# Patient Record
Sex: Female | Born: 1955 | Race: Black or African American | Hispanic: No | Smoking: Never smoker
Health system: Southern US, Community
[De-identification: ages and names within clinical notes are randomized; demographics above are authoritative.]

## PROBLEM LIST (undated history)

## (undated) DIAGNOSIS — I10 Essential (primary) hypertension: Secondary | ICD-10-CM

## (undated) HISTORY — DX: Essential (primary) hypertension: I10

---

## 2006-01-30 ENCOUNTER — Emergency Department: Payer: Self-pay | Admitting: Emergency Medicine

## 2006-09-19 ENCOUNTER — Emergency Department: Payer: Self-pay

## 2008-01-07 ENCOUNTER — Encounter: Payer: Self-pay | Admitting: Family Medicine

## 2008-01-07 ENCOUNTER — Ambulatory Visit: Payer: Self-pay | Admitting: Family Medicine

## 2008-01-16 ENCOUNTER — Encounter (INDEPENDENT_AMBULATORY_CARE_PROVIDER_SITE_OTHER): Payer: Self-pay | Admitting: *Deleted

## 2008-01-22 ENCOUNTER — Encounter (INDEPENDENT_AMBULATORY_CARE_PROVIDER_SITE_OTHER): Payer: Self-pay | Admitting: *Deleted

## 2008-01-23 ENCOUNTER — Encounter: Payer: Self-pay | Admitting: Family Medicine

## 2008-01-23 ENCOUNTER — Ambulatory Visit: Payer: Self-pay | Admitting: Internal Medicine

## 2008-01-24 ENCOUNTER — Encounter (INDEPENDENT_AMBULATORY_CARE_PROVIDER_SITE_OTHER): Payer: Self-pay | Admitting: *Deleted

## 2008-01-29 ENCOUNTER — Encounter: Payer: Self-pay | Admitting: Internal Medicine

## 2009-04-24 HISTORY — PX: ABDOMINAL HYSTERECTOMY: SHX81

## 2009-11-03 ENCOUNTER — Ambulatory Visit: Payer: Self-pay

## 2009-11-04 ENCOUNTER — Inpatient Hospital Stay: Payer: Self-pay

## 2009-11-08 LAB — PATHOLOGY REPORT

## 2011-09-11 ENCOUNTER — Emergency Department: Payer: Self-pay | Admitting: Emergency Medicine

## 2013-02-02 ENCOUNTER — Emergency Department: Payer: Self-pay | Admitting: Emergency Medicine

## 2013-02-02 LAB — BASIC METABOLIC PANEL
Anion Gap: 11 (ref 7–16)
Co2: 25 mmol/L (ref 21–32)
Creatinine: 0.71 mg/dL (ref 0.60–1.30)
EGFR (Non-African Amer.): >60
Glucose: 96 mg/dL (ref 65–99)
Potassium: 3.3 mmol/L — ABNORMAL LOW (ref 3.5–5.1)
Sodium: 143 mmol/L (ref 136–145)

## 2013-02-02 LAB — CBC
HCT: 43.9 % (ref 35.0–47.0)
MCHC: 33.5 g/dL (ref 32.0–36.0)
MCV: 93 fL (ref 80–100)
Platelet: 256 10*3/uL (ref 150–440)
RDW: 12.6 % (ref 11.5–14.5)

## 2013-05-26 ENCOUNTER — Ambulatory Visit: Payer: Self-pay | Admitting: Physician Assistant

## 2013-10-21 ENCOUNTER — Emergency Department: Payer: Self-pay | Admitting: Emergency Medicine

## 2014-03-09 ENCOUNTER — Emergency Department: Payer: Self-pay | Admitting: Emergency Medicine

## 2014-05-05 ENCOUNTER — Emergency Department: Payer: Self-pay | Admitting: Emergency Medicine

## 2014-05-05 LAB — URINALYSIS, COMPLETE
BACTERIA: NONE SEEN
Bilirubin,UR: NEGATIVE
Blood: NEGATIVE
Glucose,UR: NEGATIVE mg/dL (ref 0–75)
Hyaline Cast: 3
LEUKOCYTE ESTERASE: NEGATIVE
NITRITE: NEGATIVE
Ph: 6 (ref 4.5–8.0)
Specific Gravity: 1.026 (ref 1.003–1.030)

## 2014-05-05 LAB — COMPREHENSIVE METABOLIC PANEL
ALBUMIN: 3.9 g/dL (ref 3.4–5.0)
ALT: 84 U/L — AB
ANION GAP: 11 (ref 7–16)
AST: 93 U/L — AB (ref 15–37)
Alkaline Phosphatase: 105 U/L
BUN: 8 mg/dL (ref 7–18)
Bilirubin,Total: 0.5 mg/dL (ref 0.2–1.0)
CALCIUM: 9 mg/dL (ref 8.5–10.1)
Chloride: 106 mmol/L (ref 98–107)
Co2: 25 mmol/L (ref 21–32)
Creatinine: 0.71 mg/dL (ref 0.60–1.30)
EGFR (Non-African Amer.): >60
GLUCOSE: 99 mg/dL (ref 65–99)
Osmolality: 281 (ref 275–301)
POTASSIUM: 3.8 mmol/L (ref 3.5–5.1)
Sodium: 142 mmol/L (ref 136–145)
Total Protein: 7.9 g/dL (ref 6.4–8.2)

## 2014-05-05 LAB — DRUG SCREEN, URINE
Amphetamines, Ur Screen: NEGATIVE (ref ?–1000)
BARBITURATES, UR SCREEN: NEGATIVE (ref ?–200)
BENZODIAZEPINE, UR SCRN: NEGATIVE (ref ?–200)
CANNABINOID 50 NG, UR ~~LOC~~: NEGATIVE (ref ?–50)
Cocaine Metabolite,Ur ~~LOC~~: NEGATIVE (ref ?–300)
MDMA (ECSTASY) UR SCREEN: NEGATIVE (ref ?–500)
METHADONE, UR SCREEN: NEGATIVE (ref ?–300)
Opiate, Ur Screen: NEGATIVE (ref ?–300)
Phencyclidine (PCP) Ur S: NEGATIVE (ref ?–25)
Tricyclic, Ur Screen: NEGATIVE (ref ?–1000)

## 2014-05-05 LAB — CBC
HCT: 46.1 % (ref 35.0–47.0)
HGB: 15.3 g/dL (ref 12.0–16.0)
MCH: 30.9 pg (ref 26.0–34.0)
MCHC: 33.1 g/dL (ref 32.0–36.0)
MCV: 93 fL (ref 80–100)
PLATELETS: 254 10*3/uL (ref 150–440)
RBC: 4.94 10*6/uL (ref 3.80–5.20)
RDW: 12.5 % (ref 11.5–14.5)
WBC: 5.7 10*3/uL (ref 3.6–11.0)

## 2014-05-05 LAB — ETHANOL

## 2014-05-05 LAB — TROPONIN I: Troponin-I: <0.02 ng/mL

## 2016-02-21 ENCOUNTER — Other Ambulatory Visit: Payer: Self-pay | Admitting: Physician Assistant

## 2016-02-21 DIAGNOSIS — Z1231 Encounter for screening mammogram for malignant neoplasm of breast: Secondary | ICD-10-CM

## 2016-02-28 ENCOUNTER — Ambulatory Visit: Payer: 59 | Admitting: Family

## 2016-02-28 ENCOUNTER — Ambulatory Visit: Payer: 59 | Admitting: Family Medicine

## 2016-03-31 ENCOUNTER — Encounter: Payer: Self-pay | Admitting: Radiology

## 2016-03-31 ENCOUNTER — Ambulatory Visit
Admission: RE | Admit: 2016-03-31 | Discharge: 2016-03-31 | Disposition: A | Discharge status: Home / Self Care | Payer: 59 | Source: Ambulatory Visit | Attending: Physician Assistant | Admitting: Physician Assistant

## 2016-03-31 DIAGNOSIS — Z1231 Encounter for screening mammogram for malignant neoplasm of breast: Secondary | ICD-10-CM | POA: Insufficient documentation

## 2016-04-03 ENCOUNTER — Ambulatory Visit: Payer: 59 | Admitting: Family Medicine

## 2016-04-03 DIAGNOSIS — Z0289 Encounter for other administrative examinations: Secondary | ICD-10-CM

## 2016-04-28 ENCOUNTER — Other Ambulatory Visit: Payer: Self-pay | Admitting: Nurse Practitioner

## 2016-04-28 ENCOUNTER — Ambulatory Visit
Admission: RE | Admit: 2016-04-28 | Discharge: 2016-04-28 | Disposition: A | Discharge status: Home / Self Care | Payer: 59 | Source: Ambulatory Visit | Attending: Nurse Practitioner | Admitting: Nurse Practitioner

## 2016-04-28 DIAGNOSIS — M25561 Pain in right knee: Secondary | ICD-10-CM

## 2016-05-22 ENCOUNTER — Ambulatory Visit: Payer: 59 | Admitting: Physical Therapy

## 2016-05-24 ENCOUNTER — Encounter: Payer: 59 | Admitting: Physical Therapy

## 2016-05-30 ENCOUNTER — Encounter: Payer: 59 | Admitting: Physical Therapy

## 2016-06-01 ENCOUNTER — Encounter: Payer: 59 | Admitting: Physical Therapy

## 2016-06-05 ENCOUNTER — Ambulatory Visit: Payer: 59 | Attending: Internal Medicine | Admitting: Physical Therapy

## 2016-06-05 DIAGNOSIS — M25561 Pain in right knee: Secondary | ICD-10-CM | POA: Insufficient documentation

## 2016-06-05 NOTE — Therapy (Signed)
[facility] PHYSICAL AND SPORTS MEDICINE [address] [place], [place], [address] Phone: [phone]   Fax:  [phone]  Physical Therapy Evaluation  Patient Details  Name: Ebony CarlsGlenda F Legrand MRN: [id number] Date of Birth: 01/11/1956 Referring Provider: Beverely RisenFozia Khan  Encounter Date: 06/05/2016      PT End of Session - 06/05/16 1714    Visit Number 1   Number of Visits 3   Date for PT Re-Evaluation 07/17/16   PT Start Time 1632   PT Stop Time 1710   PT Time Calculation (min) 38 min   Activity Tolerance Patient tolerated treatment well   Behavior [facility] for tasks assessed/performed      No past medical history on file.  No past surgical history on file.  There were no vitals filed for this visit.       Subjective Assessment - 06/05/16 1720    Subjective Patient reports around the new year she began having progressively intensifying R medial knee pain, to the point where she could not walk. She has been taking topical ointment and oral pain medications and has noticed near total reduction in symptoms. She is active and performs regular gym routine, denies any recent illnesses or bowel/bladder changes. Denies a history of low back pain. She does have pain if she palpates her medial proximal tibia, otherwise no residual symptoms.    Limitations Walking;Standing   Diagnostic tests X-ray -- negative    Patient Stated Goals To figure out why she had pain    Currently in Pain? 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     Cognition   Overall Cognitive  Status Within Functional Limits for tasks assessed     Observation/Other Assessments-Edema    Edema --  WNL     Sensation   Light Touch --  WNL     Functional Tests   Functional tests Sit to Stand;Step down;Step up     Step Up   Comments --  WNL     Step Down   Comments --  WNL     Sit to Stand   Comments --  WNL     ROM / Strength   AROM / PROM / Strength --  5/5 for all MMT, no pain reported     Palpation   Palpation comment --  Mild pain with deep palpation around medial proximal tibia     FABER test   findings Negative     Slump test   Findings Negative     Straight Leg Raise   Findings Negative     Ely's Test   Findings Negative     Ambulation/Gait   Gait Comments --  WNL, symmetrical      Spinal mobilizations in lumbar spine - no pain reported, no hypomobility noted.   Educated patient on standing calf stretch for HEP.  PT Education - 06/05/16 1713    Education provided Yes   Education Details Follow up in 2 weeks, will discharge at that time if no residual symptoms.    Person(s) Educated Patient   Methods Explanation   Comprehension Verbalized understanding             PT Long Term Goals - 06/05/16 1714      PT LONG TERM GOAL #1   Title Patient will report no pain while standing >1 hour to return to work related activities.    Baseline Previously had pain with standing/ambulation.    Time 8   Period Weeks   Status New               Plan - 06/05/16 1716    Clinical Impression Statement Patient reports an insidious onset of knee pain on R side, which appears to have resolved with topical ointments and pain medication. Patient does kneel to wash her face in the morning, it is conceivable to think she may have had a bone bruise, her pain is still present but very mild with palpation of medial portion of proximal tibial shaft, but is very localized. She had no pain with provactive testing in  this session, appears to be near her baseline. WIll follow up in 2 weeks to see if symptoms are still resolving at which time no additional therapy is likely needed.    Rehab Potential Excellent   Clinical Impairments Affecting Rehab Potential No pain aside from palpation now   PT Frequency 1x / week   PT Duration 3 weeks   PT Treatment/Interventions Manual techniques;Therapeutic exercise;Therapeutic activities;ADLs/Self Care Home Management   PT Next Visit Plan Follow up on pain in   Consulted and Agree with Plan of Care Patient      Patient will benefit from skilled therapeutic intervention in order to improve the following deficits and impairments:  Pain  Visit Diagnosis: Acute pain of right knee - Plan: PT plan of care cert/re-cert     Problem List There are no active problems to display for this patient.  Alva Garnet PT, DPT, CSCS    06/05/2016, 5:28 PM   [facility] PHYSICAL AND SPORTS MEDICINE [address] [place], [address] Phone: [phone]   Fax:  [phone]  Name: SEMAJA LYMON MRN: [id number] Date of Birth: 1955-10-23

## 2016-06-06 ENCOUNTER — Encounter: Payer: 59 | Admitting: Physical Therapy

## 2016-06-08 ENCOUNTER — Encounter: Payer: 59 | Admitting: Physical Therapy

## 2016-06-12 ENCOUNTER — Encounter: Payer: 59 | Admitting: Physical Therapy

## 2016-06-15 ENCOUNTER — Encounter: Payer: 59 | Admitting: Physical Therapy

## 2016-06-22 ENCOUNTER — Ambulatory Visit: Payer: 59 | Attending: Internal Medicine | Admitting: Physical Therapy

## 2016-09-22 ENCOUNTER — Encounter: Payer: Self-pay | Admitting: Emergency Medicine

## 2016-09-22 ENCOUNTER — Emergency Department
Admission: EM | Admit: 2016-09-22 | Discharge: 2016-09-22 | Disposition: A | Discharge status: Home / Self Care | Payer: 59 | Attending: Emergency Medicine | Admitting: Emergency Medicine

## 2016-09-22 DIAGNOSIS — M25561 Pain in right knee: Secondary | ICD-10-CM | POA: Diagnosis present

## 2016-09-22 MED ORDER — NAPROXEN 500 MG PO TABS
500.0000 mg | ORAL_TABLET | Freq: Once | ORAL | Status: AC
  Administered 2016-09-22: 500 mg via ORAL
  Filled 2016-09-22: qty 1

## 2016-09-22 MED ORDER — TRAMADOL HCL 50 MG PO TABS: 50.0000 mg | ORAL_TABLET | Freq: Four times a day (QID) | ORAL | 0 refills | Status: DC | PRN

## 2016-09-22 MED ORDER — NAPROXEN 500 MG PO TABS: 500.0000 mg | ORAL_TABLET | Freq: Two times a day (BID) | ORAL | Status: DC

## 2016-09-22 MED ORDER — TRAMADOL HCL 50 MG PO TABS
50.0000 mg | ORAL_TABLET | Freq: Once | ORAL | Status: DC
  Filled 2016-09-22: qty 1

## 2016-09-22 NOTE — ED Triage Notes (Signed)
Pt c/o right knee pain that started 3-4 days ago.  Had similar pain couple months ago and got better. No deformity noted. Pt ambulatory to triage; did not want wheelchair. No redness.  Pain with movement.

## 2016-09-22 NOTE — ED Provider Notes (Signed)
[facility] Emergency Department Provider Note   ____________________________________________   First MD Initiated Contact with Patient 09/22/16 1812     (approximate)  I have reviewed the triage vital signs and the nursing notes.   HISTORY  Chief Complaint Knee Pain    HPI Ebony Hutchinson is a 61 y.o. female patient complaining of right knee pain as started 3-4 days ago. Patient has similar pain a couple months ago and it resolved with one session of physical therapy. Patient denies any provocative incident to this pain. Patient appears on the medial aspect of her right knee. Patient states pain increases with flexion. Patient denies loss of sensation. Patient state he was x-ray was unremarkable couple months ago.Patient rates the pain as a 10 over 10. Patient describes pain as "achy". No recent palliative measures for this complaint   History reviewed. No pertinent past medical history.  Patient Active Problem List   Diagnosis Date Noted  . Acute pain of right knee 09/22/2016    Past Surgical History:  Procedure Laterality Date  . ABDOMINAL HYSTERECTOMY      Prior to Admission medications   Medication Sig Start Date End Date Taking? Authorizing Provider  naproxen (NAPROSYN) 500 MG tablet Take 1 tablet (500 mg total) by mouth 2 (two) times daily with a meal. 09/22/16   Joni ReiningSmith, Ronald K, PA-C  traMADol (ULTRAM) 50 MG tablet Take 1 tablet (50 mg total) by mouth every 6 (six) hours as needed for moderate pain. 09/22/16   Joni ReiningSmith, Ronald K, PA-C    Allergies Codeine  History reviewed. No pertinent family history.  Social History Social History  Substance Use Topics  . Smoking status: Never Smoker  . Smokeless tobacco: Never Used  . Alcohol use Yes     Comment: 2 beer per day    Review of Systems Constitutional: No fever/chills Eyes: No visual changes. ENT: No sore throat. Cardiovascular: Denies chest pain. Respiratory: Denies shortness of  breath. Gastrointestinal: No abdominal pain.  No nausea, no vomiting.  No diarrhea.  No constipation. Genitourinary: Negative for dysuria. Musculoskeletal: Right medial knee pain. Skin: Negative for rash. Neurological: Negative for headaches, focal weakness or numbness. Allergic/Immunilogical: Codeine ____________________________________________   PHYSICAL EXAM:  VITAL SIGNS: ED Triage Vitals  Enc Vitals Group     BP 09/22/16 1723 (!) 160/92     Pulse Rate 09/22/16 1723 81     Resp 09/22/16 1723 18     Temp 09/22/16 1723 98.4 F (36.9 C)     Temp Source 09/22/16 1723 Oral     SpO2 09/22/16 1723 98 %     Weight 09/22/16 1722 170 lb (77.1 kg)     Height 09/22/16 1722 5\' 2"  (1.575 m)     Head Circumference --      Peak Flow --      Pain Score 09/22/16 1722 10     Pain Loc --      Pain Edu? --      Excl. in GC? --     Constitutional: Alert and oriented. Well appearing and in no acute distress. Eyes: Conjunctivae are normal. PERRL. EOMI. Head: Atraumatic. Nose: No congestion/rhinnorhea. Mouth/Throat: Mucous membranes are moist.  Oropharynx non-erythematous. Neck: No stridor.  No cervical spine tenderness to palpation. Hematological/Lymphatic/Immunilogical: No cervical lymphadenopathy. Cardiovascular: Normal rate, regular rhythm. Grossly normal heart sounds.  Good peripheral circulation. Respiratory: Normal respiratory effort.  No retractions. Lungs CTAB. Gastrointestinal: Soft and nontender. No distention. No abdominal bruits. No CVA tenderness.  Musculoskeletal: No obvious deformity or edema to the right knee. Patient has moderate guarding palpation at the insertion point of the MCL. Patient has full range of motion but has guarding and a Clements pain when flexing the knee. Patient has normal gait.  Neurologic:  Normal speech and language. No gross focal neurologic deficits are appreciated. No gait instability. Skin:  Skin is warm, dry and intact. No rash noted. Psychiatric:  Mood and affect are normal. Speech and behavior are normal.  ____________________________________________   LABS (all labs ordered are listed, but only abnormal results are displayed)  Labs Reviewed - No data to display ____________________________________________  EKG   ____________________________________________  RADIOLOGY  Reviewed previous x-ray with no acute findings. ____________________________________________   PROCEDURES  Procedure(s) performed: None  Procedures  Critical Care performed: No  ____________________________________________   INITIAL IMPRESSION / ASSESSMENT AND PLAN / ED COURSE  Pertinent labs & imaging results that were available during my care of the patient were reviewed by me and considered in my medical decision making (see chart for details).  Knee pain secondary to MCL strain. Patient placed in a knee immobilizer. Patient given discharge care instructions and advised to follow-up with orthopedics for definitive evaluation and treatment.      ____________________________________________   FINAL CLINICAL IMPRESSION(S) / ED DIAGNOSES  Final diagnoses:  Acute pain of right knee      NEW MEDICATIONS STARTED DURING THIS VISIT:  New Prescriptions   NAPROXEN (NAPROSYN) 500 MG TABLET    Take 1 tablet (500 mg total) by mouth 2 (two) times daily with a meal.   TRAMADOL (ULTRAM) 50 MG TABLET    Take 1 tablet (50 mg total) by mouth every 6 (six) hours as needed for moderate pain.     Note:  This document was prepared using Dragon voice recognition software and may include unintentional dictation errors.    Joni Reining, PA-C 09/22/16 1817    Nita Sickle, MD 09/26/16 [phone]

## 2016-09-22 NOTE — Discharge Instructions (Signed)
Knee support until evaluation by orthopedics. Call Monday morning for appointment

## 2016-09-28 DIAGNOSIS — M25561 Pain in right knee: Secondary | ICD-10-CM | POA: Diagnosis not present

## 2017-05-31 IMAGING — CR DG KNEE COMPLETE 4+V*R*
1 series · 4 of 4 positions shown · non-contrast
Comparison: None.

CLINICAL DATA: Right knee pain for 2 weeks without known injury.

EXAM:
RIGHT KNEE - COMPLETE 4+ VIEW

[Series 1: dg knee complete 4 views right · 0.14mm/px · 4 of 4 slices shown]
[im 1/4]
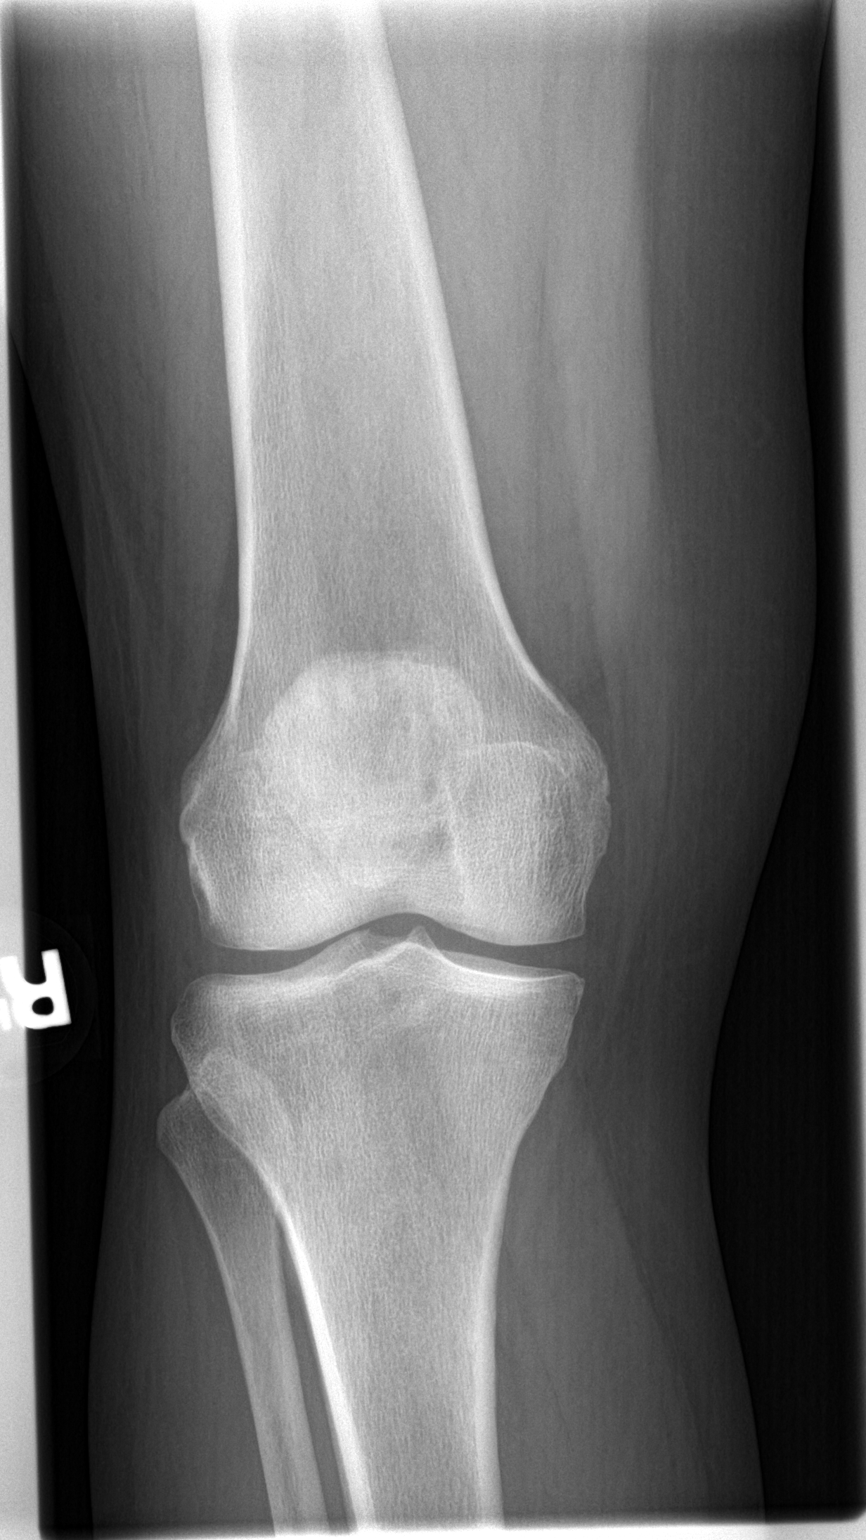
[im 2/4]
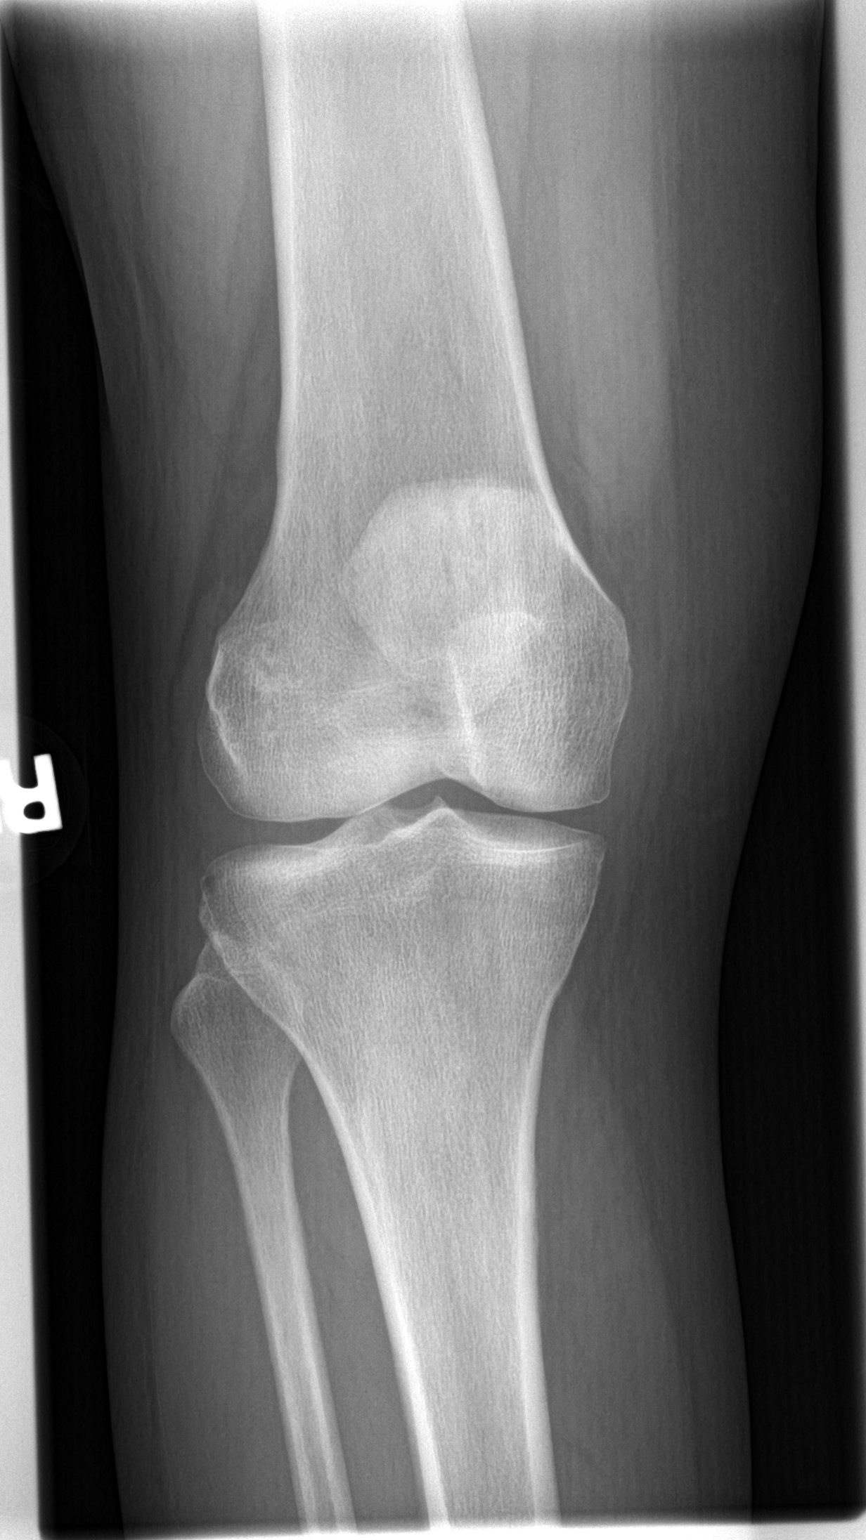
[im 3/4]
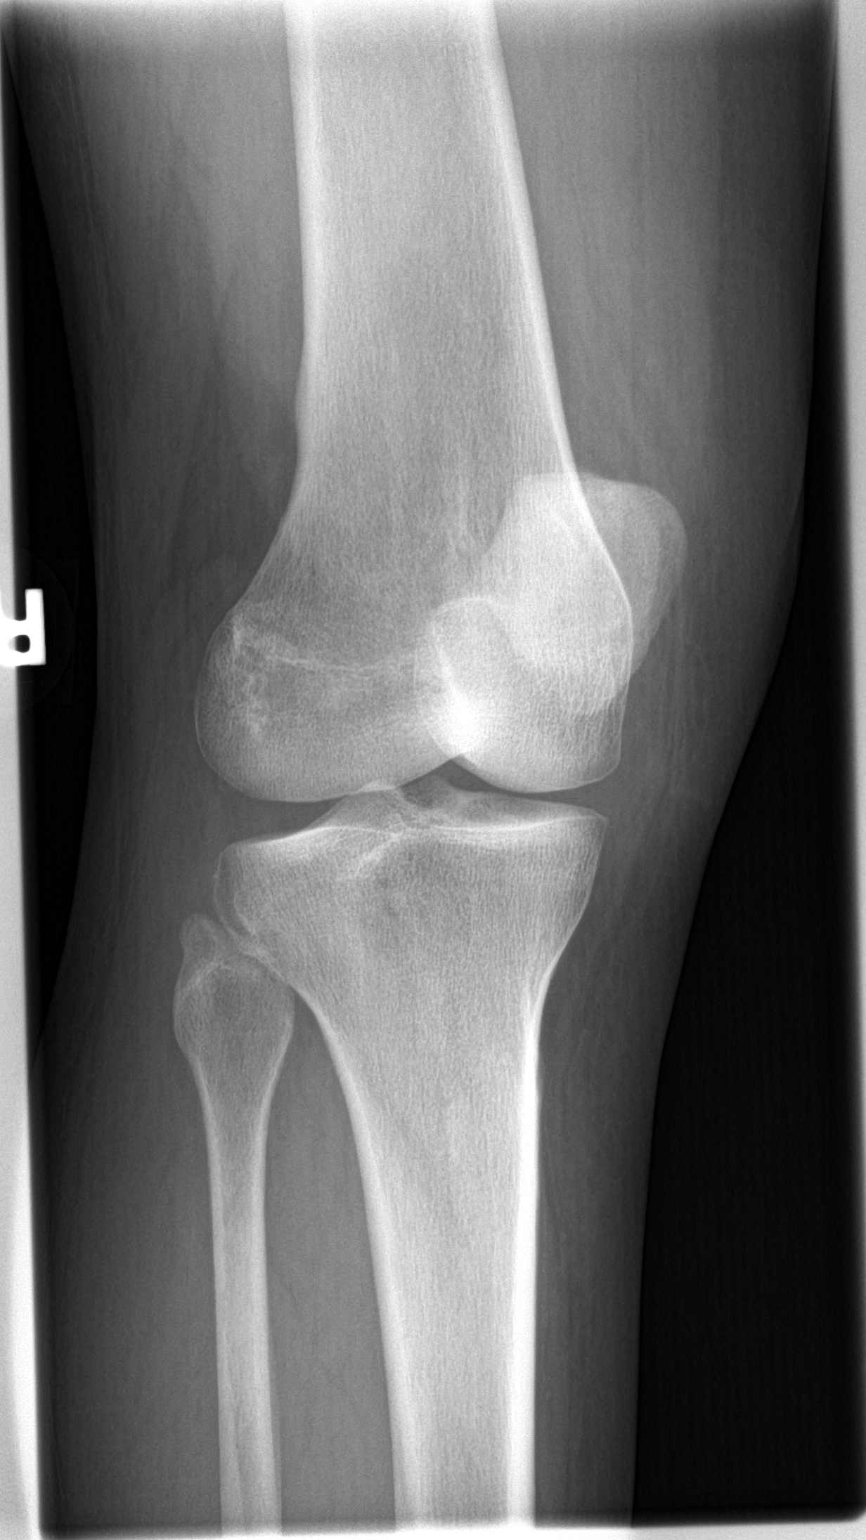
[im 4/4]
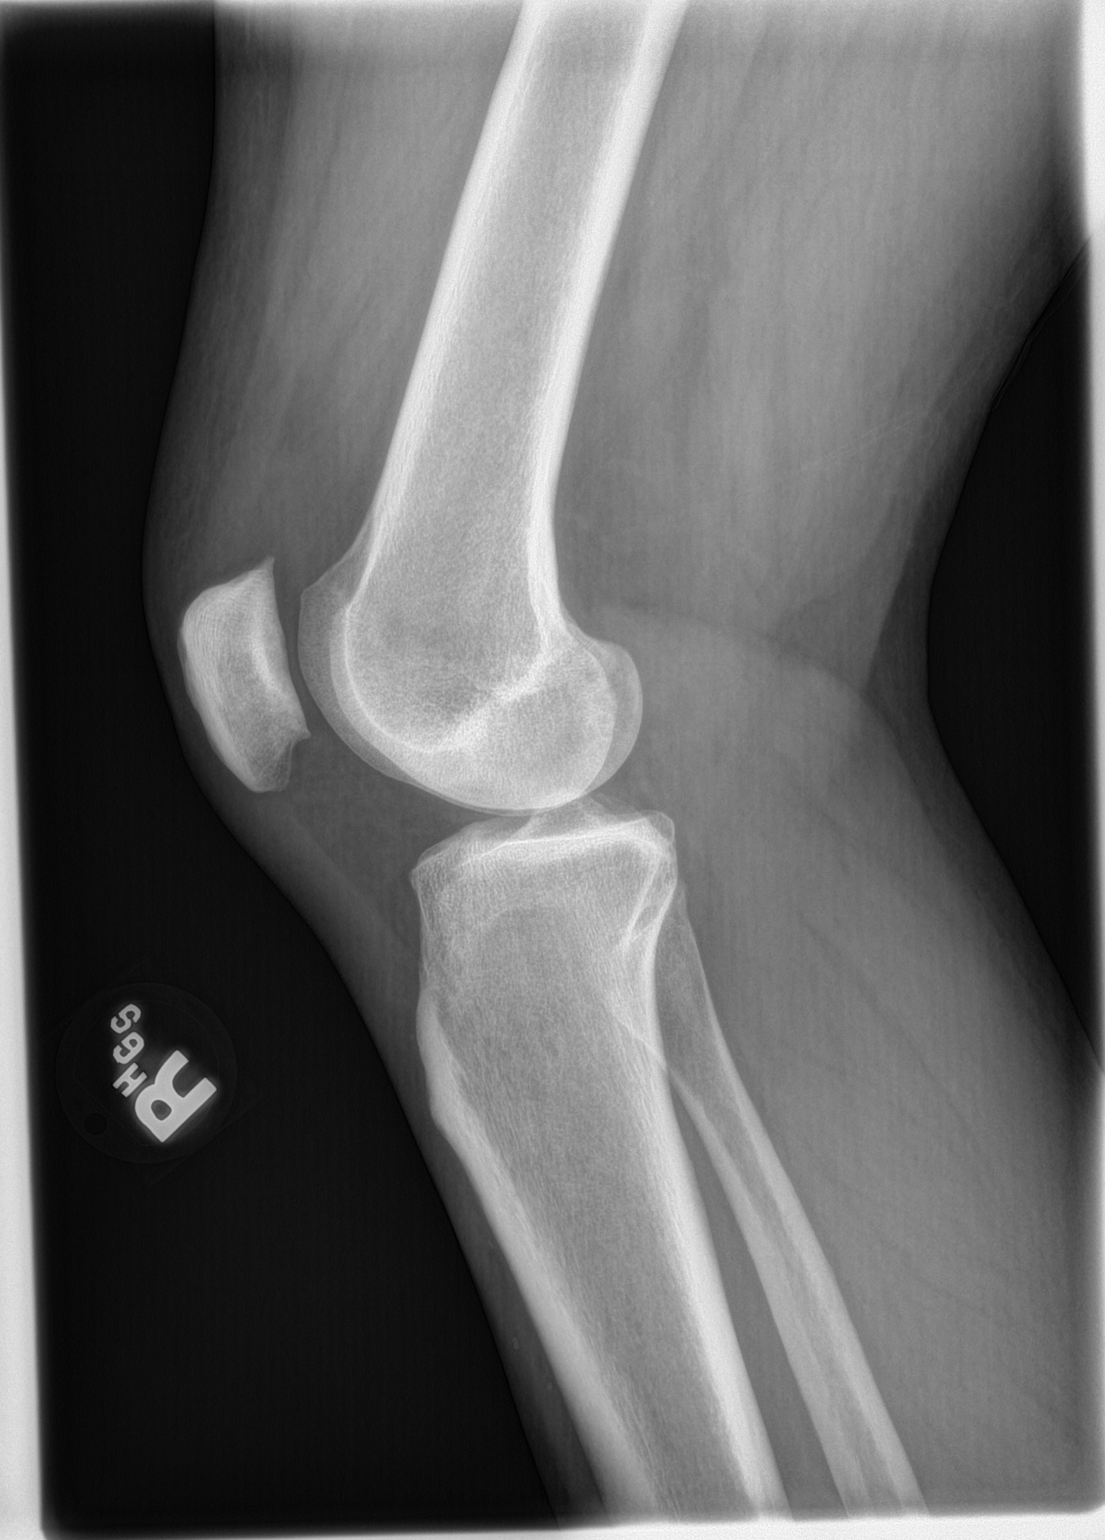

[4 of 4 positions shown; findings below may reference images not displayed]

FINDINGS: No evidence of fracture, dislocation, or joint effusion. No evidence
of arthropathy or other focal bone abnormality. Soft tissues are
unremarkable.
IMPRESSION: Normal right knee.

## 2017-09-21 ENCOUNTER — Other Ambulatory Visit: Payer: Self-pay

## 2017-09-21 ENCOUNTER — Emergency Department
Admission: EM | Admit: 2017-09-21 | Discharge: 2017-09-21 | Disposition: A | Discharge status: Home / Self Care | Payer: No Typology Code available for payment source | Attending: Emergency Medicine | Admitting: Emergency Medicine

## 2017-09-21 ENCOUNTER — Encounter: Payer: Self-pay | Admitting: Emergency Medicine

## 2017-09-21 DIAGNOSIS — S233XXA Sprain of ligaments of thoracic spine, initial encounter: Secondary | ICD-10-CM | POA: Diagnosis not present

## 2017-09-21 DIAGNOSIS — Y9289 Other specified places as the place of occurrence of the external cause: Secondary | ICD-10-CM | POA: Insufficient documentation

## 2017-09-21 DIAGNOSIS — S239XXA Sprain of unspecified parts of thorax, initial encounter: Secondary | ICD-10-CM | POA: Insufficient documentation

## 2017-09-21 DIAGNOSIS — M62830 Muscle spasm of back: Secondary | ICD-10-CM | POA: Insufficient documentation

## 2017-09-21 DIAGNOSIS — M549 Dorsalgia, unspecified: Secondary | ICD-10-CM | POA: Diagnosis not present

## 2017-09-21 DIAGNOSIS — Y990 Civilian activity done for income or pay: Secondary | ICD-10-CM | POA: Insufficient documentation

## 2017-09-21 DIAGNOSIS — X509XXA Other and unspecified overexertion or strenuous movements or postures, initial encounter: Secondary | ICD-10-CM | POA: Diagnosis not present

## 2017-09-21 DIAGNOSIS — S299XXA Unspecified injury of thorax, initial encounter: Secondary | ICD-10-CM | POA: Diagnosis present

## 2017-09-21 DIAGNOSIS — Y9389 Activity, other specified: Secondary | ICD-10-CM | POA: Insufficient documentation

## 2017-09-21 MED ORDER — KETOROLAC TROMETHAMINE 10 MG PO TABS: 10.0000 mg | ORAL_TABLET | Freq: Three times a day (TID) | ORAL | 0 refills | Status: DC

## 2017-09-21 MED ORDER — CYCLOBENZAPRINE HCL 5 MG PO TABS: 5.0000 mg | ORAL_TABLET | Freq: Three times a day (TID) | ORAL | 0 refills | Status: DC | PRN

## 2017-09-21 MED ORDER — KETOROLAC TROMETHAMINE 30 MG/ML IJ SOLN
30.0000 mg | Freq: Once | INTRAMUSCULAR | Status: AC
  Administered 2017-09-21: 30 mg via INTRAMUSCULAR
  Filled 2017-09-21: qty 1

## 2017-09-21 NOTE — Discharge Instructions (Signed)
Your exam and EKG are reassuring at this time. You have likely strained a muscle (rhomboid) in the midback. This has caused muscle pain and spasm. Take the prescription meds as directed. Follow-up with your provider for ongoing symptoms. Apply ice and/or moist heat to reduce symptoms.

## 2017-09-21 NOTE — ED Triage Notes (Signed)
Brought in via ems from  Work  States she reached across the desk for a folder and felt a pain to mid back   Pain is now shooting into breast  Increased pain with movement and resp

## 2017-09-21 NOTE — ED Provider Notes (Signed)
[facility] Emergency Department Provider Note ____________________________________________  Time seen: 1017  I have reviewed the triage vital signs and the nursing notes.  HISTORY  Chief Complaint  Back Pain  HPI Ebony Hutchinson is a 62 y.o. female presents to the ED, via EMS, from her workplace.  Patient describes sudden onset of mid back pain after she reached across her desk to retrieve a file folder.  Patient describes a sharp pain just medial to her shoulder blade between the spine.  She notes that pain is now shooting into her breast.  She has pain in the same region with movement of the right upper extremity and deep respirations.  She denies any substernal chest pain, nausea, vomiting, or diaphoresis.  She also denies any neck pain, visual disturbance, headache, or syncope.  Patient denies any significant medical history.  She has not take any medications in the interim.  History reviewed. No pertinent past medical history.  Patient Active Problem List   Diagnosis Date Noted  . Acute pain of right knee 09/22/2016    Past Surgical History:  Procedure Laterality Date  . ABDOMINAL HYSTERECTOMY      Prior to Admission medications   Medication Sig Start Date End Date Taking? Authorizing Provider  cyclobenzaprine (FLEXERIL) 5 MG tablet Take 1 tablet (5 mg total) by mouth 3 (three) times daily as needed for muscle spasms. 09/21/17   Journe Hallmark, Charlesetta Ivory, PA-C  ketorolac (TORADOL) 10 MG tablet Take 1 tablet (10 mg total) by mouth every 8 (eight) hours. 09/21/17   Sabrena Gavitt, Charlesetta Ivory, PA-C    Allergies Codeine  History reviewed. No pertinent family history.  Social History Social History   Tobacco Use  . Smoking status: Never Smoker  . Smokeless tobacco: Never Used  Substance Use Topics  . Alcohol use: Yes    Comment: 2 beer per day  . Drug use: No    Review of Systems  Constitutional: Negative for fever. Eyes: Negative for visual  changes. ENT: Negative for sore throat. Cardiovascular: Negative for chest pain. Respiratory: Negative for shortness of breath. Gastrointestinal: Negative for abdominal pain, vomiting and diarrhea. Genitourinary: Negative for dysuria. Musculoskeletal: Positive for midback pain. Skin: Negative for rash. Neurological: Negative for headaches, focal weakness or numbness. ____________________________________________  PHYSICAL EXAM:  VITAL SIGNS: ED Triage Vitals  Enc Vitals Group     BP 09/21/17 0959 (!) 181/90     Pulse Rate 09/21/17 0959 67     Resp 09/21/17 0959 18     Temp 09/21/17 0959 97.8 F (36.6 C)     Temp Source 09/21/17 0959 Oral     SpO2 09/21/17 0959 100 %     Weight 09/21/17 1000 172 lb (78 kg)     Height 09/21/17 1000  (1.575 m)     Head Circumference --      Peak Flow --      Pain Score 09/21/17 1000 7     Pain Loc --      Pain Edu? --      Excl. in GC? --     Constitutional: Alert and oriented. Well appearing and in no distress. Head: Normocephalic and atraumatic. Eyes: Conjunctivae are normal. Normal extraocular movements Neck: Supple. No thyromegaly.  Normal range of motion without crepitus.  No distracting midline tenderness is noted. Cardiovascular: Normal rate, regular rhythm. Normal distal pulses. Respiratory: Normal respiratory effort. No wheezes/rales/rhonchi.  Musculoskeletal: Nontender with normal range of motion in all extremities.  Normal  spinal alignment without midline tenderness, spasm, deformity, or step-off.  Patient is exquisitely tender to palpation to the right scapulothoracic region over the rhomboids.  Palpable muscle spasm and a knot is appreciated.  Normal resistance testing to the upper extremities.  Normal composite fist distally.  No rotator cuff deficits appreciated. Neurologic: Cranial nerves II through XII grossly intact.  Normal UE DTRs bilaterally.  Normal gait without ataxia. Normal speech and language. No gross focal  neurologic deficits are appreciated. Skin:  Skin is warm, dry and intact. No rash noted. Psychiatric: Mood and affect are normal. Patient exhibits appropriate insight and judgment. ____________________________________________  PROCEDURES  Procedures Toradol 30 mg IM ____________________________________________  INITIAL IMPRESSION / ASSESSMENT AND PLAN / ED COURSE  Patient with ED evaluation of sudden right sided scapulothoracic pain following reaching maneuver.  Patient symptoms likely represent an acute thoracic muscle strain and spasm.  Reproducible pain is elicited with palpation over the rhomboid in the right scapular thoracic region.  Patient's exam is overall reassuring.  She will be discharged with a prescription for ketorolac and Flexeril dose as needed.  Patient is prepared to return to work to continue some emails and tasks that she has to finish.  Return precautions have been reviewed.  She will see her primary provider for ongoing symptom management.  Work note is provided for her part-time job as requested. ____________________________________________  FINAL CLINICAL IMPRESSION(S) / ED DIAGNOSES  Final diagnoses:  Muscle spasm of back  Thoracic back sprain, initial encounter      Lissa Hoard, PA-C 09/21/17 1103    Jene Every, MD 09/21/17 1157

## 2018-06-03 ENCOUNTER — Ambulatory Visit: Payer: Self-pay | Admitting: Nurse Practitioner

## 2018-06-07 ENCOUNTER — Ambulatory Visit: Payer: 59 | Admitting: Nurse Practitioner

## 2018-06-07 ENCOUNTER — Encounter: Payer: Self-pay | Admitting: Nurse Practitioner

## 2018-06-07 VITALS — BP 162/104 | HR 105 | Resp 16 | Ht 62.0 in | Wt 174.2 lb

## 2018-06-07 DIAGNOSIS — I10 Essential (primary) hypertension: Secondary | ICD-10-CM | POA: Diagnosis not present

## 2018-06-07 DIAGNOSIS — Z1239 Encounter for other screening for malignant neoplasm of breast: Secondary | ICD-10-CM

## 2018-06-07 DIAGNOSIS — M545 Low back pain, unspecified: Secondary | ICD-10-CM

## 2018-06-07 DIAGNOSIS — M549 Dorsalgia, unspecified: Secondary | ICD-10-CM | POA: Insufficient documentation

## 2018-06-07 MED ORDER — TIZANIDINE HCL 4 MG PO TABS: 4.0000 mg | ORAL_TABLET | Freq: Every evening | ORAL | 1 refills | Status: DC | PRN

## 2018-06-07 MED ORDER — MELOXICAM 7.5 MG PO TABS: 7.5000 mg | ORAL_TABLET | Freq: Every day | ORAL | 3 refills | Status: DC

## 2018-06-07 MED ORDER — ATENOLOL 25 MG PO TABS: 25.0000 mg | ORAL_TABLET | Freq: Every day | ORAL | 3 refills | Status: DC

## 2018-06-07 NOTE — Progress Notes (Signed)
[facility] [address] [place], [place] [address]  Internal MEDICINE  Office Visit Note  Patient Name: Ebony Hutchinson  [id number]  [id number]  Date of Service: 06/12/2018   Complaints/HPI Pt is here for establishment of PCP. Chief Complaint  Patient presents with  . New Patient (Initial Visit)    pt concerned about weight  . Hypertension    pt concerned about hypertension at the end of the day she has a headache at the base of her neck, pt stated that she use to be on medication for her blood pressure a while back   The patient is here to reestablish PCP provider.  Blood pressure is elevated as is heart rate. She does have mid back pain. This is intermittent. Pain gets much worse through the day while at work. Will ache and get very sore between and just under the shoulder blades. Will get a little better by the time she wakes up in the morning. In May, 2019, she was seen in the ER for sudden and severe spasm in this area of the back. Was given IM injection of toradol and take home prescription of flexeril. She states flexeril did work, but she did not like how it made her feel.    Current Medication: Outpatient Encounter Medications as of 06/07/2018  Medication Sig  . atenolol (TENORMIN) 25 MG tablet Take 1 tablet (25 mg total) by mouth daily.  . meloxicam (MOBIC) 7.5 MG tablet Take 1 tablet (7.5 mg total) by mouth daily.  Marland Kitchen tiZANidine (ZANAFLEX) 4 MG tablet Take 1 tablet (4 mg total) by mouth at bedtime as needed for muscle spasms.  . [DISCONTINUED] cyclobenzaprine (FLEXERIL) 5 MG tablet Take 1 tablet (5 mg total) by mouth 3 (three) times daily as needed for muscle spasms.  . [DISCONTINUED] ketorolac (TORADOL) 10 MG tablet Take 1 tablet (10 mg total) by mouth every 8 (eight) hours.   No facility-administered encounter medications on file as of 06/07/2018.     Surgical History: Past Surgical History:  Procedure Laterality Date  . ABDOMINAL HYSTERECTOMY  2011     Medical History: History reviewed. No pertinent past medical history.  Family History: Family History  Problem Relation Age of Onset  . Colon cancer Mother        mom had colon cancer and it was removed  . Heart Problems Father   . Heart attack Brother     Social History   Socioeconomic History  . Marital status: Divorced    Spouse name: Not on file  . Number of children: Not on file  . Years of education: Not on file  . Highest education level: Not on file  Occupational History  . Not on file  Social Needs  . Financial resource strain: Not on file  . Food insecurity:    Worry: Not on file    Inability: Not on file  . Transportation needs:    Medical: Not on file    Non-medical: Not on file  Tobacco Use  . Smoking status: Never Smoker  . Smokeless tobacco: Never Used  Substance and Sexual Activity  . Alcohol use: Yes    Comment: occasionally  . Drug use: No  . Sexual activity: Not on file  Lifestyle  . Physical activity:    Days per week: Not on file    Minutes per session: Not on file  . Stress: Not on file  Relationships  . Social connections:    Talks on phone: Not  on file    Gets together: Not on file    Attends religious service: Not on file    Active member of club or organization: Not on file    Attends meetings of clubs or organizations: Not on file    Relationship status: Not on file  . Intimate partner violence:    Fear of current or ex partner: Not on file    Emotionally abused: Not on file    Physically abused: Not on file    Forced sexual activity: Not on file  Other Topics Concern  . Not on file  Social History Narrative  . Not on file     Review of Systems  Constitutional: Negative for activity change, chills, fatigue and unexpected weight change.  HENT: Negative for congestion, postnasal drip, rhinorrhea, sneezing and sore throat.   Respiratory: Negative for cough, chest tightness, shortness of breath and wheezing.    Cardiovascular: Negative for chest pain and palpitations.       Elevated blood pressure  Gastrointestinal: Negative for abdominal pain, constipation, diarrhea, nausea and vomiting.  Endocrine: Negative for cold intolerance, heat intolerance, polydipsia and polyuria.  Musculoskeletal: Positive for back pain and myalgias. Negative for arthralgias, joint swelling and neck pain.  Skin: Negative for rash.  Allergic/Immunologic: Negative for environmental allergies.  Neurological: Positive for headaches. Negative for dizziness, tremors and numbness.  Hematological: Negative for adenopathy. Does not bruise/bleed easily.  Psychiatric/Behavioral: Negative for behavioral problems (Depression), sleep disturbance and suicidal ideas. The patient is not nervous/anxious.     Today's Vitals   06/07/18 1436  BP: (!) 162/104  Pulse: (!) 105  Resp: 16  SpO2: 98%  Weight: 174 lb 3.2 oz (79 kg)  Height: 5\' 2"  (1.575 m)   Body mass index is 31.86 kg/m.  Physical Exam Vitals signs and nursing note reviewed.  Constitutional:      General: She is not in acute distress.    Appearance: Normal appearance. She is well-developed. She is not diaphoretic.  HENT:     Head: Normocephalic and atraumatic.     Mouth/Throat:     Pharynx: No oropharyngeal exudate.  Eyes:     Conjunctiva/sclera: Conjunctivae normal.     Pupils: Pupils are equal, round, and reactive to light.  Neck:     Musculoskeletal: Normal range of motion and neck supple.     Thyroid: No thyromegaly.     Vascular: No carotid bruit or JVD.     Trachea: No tracheal deviation.  Cardiovascular:     Rate and Rhythm: Normal rate and regular rhythm.     Heart sounds: Normal heart sounds. No murmur. No friction rub. No gallop.      Comments: Mild tachycardia Pulmonary:     Effort: Pulmonary effort is normal. No respiratory distress.     Breath sounds: Normal breath sounds. No wheezing or rales.  Chest:     Chest wall: No tenderness.   Abdominal:     General: Bowel sounds are normal.     Palpations: Abdomen is soft.  Musculoskeletal: Normal range of motion.        General: Tenderness present.     Comments: Moderate lower back pain, radiates into the hips and down the back of the legs. Pain more severe when sitting for long periods of time. Exertion also increases discomfort. No visible or palpable abnormalities noted at this time.   Lymphadenopathy:     Cervical: No cervical adenopathy.  Skin:    General: Skin is warm and  dry.  Neurological:     Mental Status: She is alert and oriented to person, place, and time.     Cranial Nerves: No cranial nerve deficit.  Psychiatric:        Behavior: Behavior normal.        Thought Content: Thought content normal.        Judgment: Judgment normal.   Assessment/Plan: 1. Essential hypertension Start atenolol 25mg  po qpm. Discussed heart healthy diet.encouraged routine exercise to help lower blood pressure.  - atenolol (TENORMIN) 25 MG tablet; Take 1 tablet (25 mg total) by mouth daily.  Dispense: 30 tablet; Refill: 3  2. Low back pain, unspecified back pain laterality, unspecified chronicity, unspecified whether sciatica present Start meloxicam 7.5mg  daily to reduce pain and inflammation. May take tizanidine 4mg  at bedtime as needed to reduce muscle pain and tightness. Recommended she start with 1/2 tablet and increase to whole tablet as needed and as tolerated.  - meloxicam (MOBIC) 7.5 MG tablet; Take 1 tablet (7.5 mg total) by mouth daily.  Dispense: 30 tablet; Refill: 3 - tiZANidine (ZANAFLEX) 4 MG tablet; Take 1 tablet (4 mg total) by mouth at bedtime as needed for muscle spasms.  Dispense: 30 tablet; Refill: 1  3. Screening for breast cancer - MM 3D SCREEN BREAST BILATERAL; Future  General Counseling: Conda verbalizes understanding of the findings of todays visit and agrees with plan of treatment. I have discussed any further diagnostic evaluation that may be needed or  ordered today. We also reviewed her medications today. she has been encouraged to call the office with any questions or concerns that should arise related to todays visit.    Counseling:  Hypertension Counseling:   The following hypertensive lifestyle modification were recommended and discussed:  1. Limiting alcohol intake to less than 1 oz/day of ethanol:(24 oz of beer or 8 oz of wine or 2 oz of 100-proof whiskey). 2. Take baby ASA 81 mg daily. 3. Importance of regular aerobic exercise and losing weight. 4. Reduce dietary saturated fat and cholesterol intake for overall cardiovascular health. 5. Maintaining adequate dietary potassium, calcium, and magnesium intake. 6. Regular monitoring of the blood pressure. 7. Reduce sodium intake to less than 100 mmol/day (less than 2.3 gm of sodium or less than 6 gm of sodium choride)   This patient was seen by Vincent GrosHeather Sonny Poth FNP Collaboration with Dr Lyndon CodeFozia M Khan as a part of collaborative care agreement  Orders Placed This Encounter  Procedures  . MM 3D SCREEN BREAST BILATERAL    Meds ordered this encounter  Medications  . atenolol (TENORMIN) 25 MG tablet    Sig: Take 1 tablet (25 mg total) by mouth daily.    Dispense:  30 tablet    Refill:  3    Order Specific Question:   Supervising Provider    Answer:   Lyndon CodeKHAN, FOZIA M [[id number]]  . meloxicam (MOBIC) 7.5 MG tablet    Sig: Take 1 tablet (7.5 mg total) by mouth daily.    Dispense:  30 tablet    Refill:  3    Order Specific Question:   Supervising Provider    Answer:   Lyndon CodeKHAN, FOZIA M [[id number]]  . tiZANidine (ZANAFLEX) 4 MG tablet    Sig: Take 1 tablet (4 mg total) by mouth at bedtime as needed for muscle spasms.    Dispense:  30 tablet    Refill:  1    Order Specific Question:   Supervising Provider    Answer:  Beverely Risen M [[id number]]    Time spent: 30 Minutes

## 2018-06-07 NOTE — Progress Notes (Signed)
Pt blood pressure  Is elevated taken the twice

## 2018-06-12 DIAGNOSIS — Z0001 Encounter for general adult medical examination with abnormal findings: Secondary | ICD-10-CM | POA: Insufficient documentation

## 2018-06-12 DIAGNOSIS — I10 Essential (primary) hypertension: Secondary | ICD-10-CM | POA: Insufficient documentation

## 2018-06-25 ENCOUNTER — Other Ambulatory Visit: Payer: Self-pay

## 2018-06-25 ENCOUNTER — Other Ambulatory Visit
Admission: RE | Admit: 2018-06-25 | Discharge: 2018-06-25 | Disposition: A | Discharge status: Home / Self Care | Payer: BLUE CROSS/BLUE SHIELD | Source: Ambulatory Visit | Attending: Nurse Practitioner | Admitting: Nurse Practitioner

## 2018-06-25 DIAGNOSIS — Z0000 Encounter for general adult medical examination without abnormal findings: Secondary | ICD-10-CM | POA: Diagnosis not present

## 2018-06-25 DIAGNOSIS — I10 Essential (primary) hypertension: Secondary | ICD-10-CM | POA: Diagnosis not present

## 2018-06-25 DIAGNOSIS — E559 Vitamin D deficiency, unspecified: Secondary | ICD-10-CM | POA: Diagnosis not present

## 2018-06-25 LAB — COMPREHENSIVE METABOLIC PANEL
ALBUMIN: 4.1 g/dL (ref 3.5–5.0)
ALT: 29 U/L (ref 0–44)
AST: 25 U/L (ref 15–41)
Alkaline Phosphatase: 88 U/L (ref 38–126)
Anion gap: 9 (ref 5–15)
BUN: 14 mg/dL (ref 8–23)
CHLORIDE: 110 mmol/L (ref 98–111)
CO2: 22 mmol/L (ref 22–32)
CREATININE: 0.62 mg/dL (ref 0.44–1.00)
Calcium: 9 mg/dL (ref 8.9–10.3)
GFR calc Af Amer: >60 mL/min (ref >60)
GLUCOSE: 142 mg/dL — AB (ref 70–99)
Potassium: 4 mmol/L (ref 3.5–5.1)
SODIUM: 141 mmol/L (ref 135–145)
Total Bilirubin: 0.6 mg/dL (ref 0.3–1.2)
Total Protein: 7.6 g/dL (ref 6.5–8.1)

## 2018-06-25 LAB — CBC
HEMATOCRIT: 42.1 % (ref 36.0–46.0)
HEMOGLOBIN: 14 g/dL (ref 12.0–15.0)
MCH: 29.4 pg (ref 26.0–34.0)
MCHC: 33.3 g/dL (ref 30.0–36.0)
MCV: 88.4 fL (ref 80.0–100.0)
PLATELETS: 250 10*3/uL (ref 150–400)
RBC: 4.76 MIL/uL (ref 3.87–5.11)
RDW: 12.8 % (ref 11.5–15.5)
WBC: 5.3 10*3/uL (ref 4.0–10.5)
nRBC: 0 % (ref 0.0–0.2)

## 2018-06-25 LAB — TSH: TSH: 2.719 u[IU]/mL (ref 0.350–4.500)

## 2018-06-25 LAB — LIPID PANEL
Cholesterol: 287 mg/dL — ABNORMAL HIGH (ref 0–200)
HDL: 49 mg/dL (ref >40)
LDL CALC: 197 mg/dL — AB (ref 0–99)
Total CHOL/HDL Ratio: 5.9 RATIO
Triglycerides: 203 mg/dL — ABNORMAL HIGH (ref <150)
VLDL: 41 mg/dL — ABNORMAL HIGH (ref 0–40)

## 2018-06-25 LAB — T4, FREE: FREE T4: 0.84 ng/dL (ref 0.82–1.77)

## 2018-06-26 LAB — VITAMIN D 25 HYDROXY (VIT D DEFICIENCY, FRACTURES): VIT D 25 HYDROXY: 5.2 ng/mL — AB (ref 30.0–100.0)

## 2018-07-08 ENCOUNTER — Other Ambulatory Visit: Payer: Self-pay

## 2018-07-08 ENCOUNTER — Ambulatory Visit (INDEPENDENT_AMBULATORY_CARE_PROVIDER_SITE_OTHER): Payer: BLUE CROSS/BLUE SHIELD | Admitting: Nurse Practitioner

## 2018-07-08 ENCOUNTER — Encounter: Payer: Self-pay | Admitting: Nurse Practitioner

## 2018-07-08 VITALS — BP 158/86 | HR 84 | Resp 16 | Ht 62.0 in | Wt 178.4 lb

## 2018-07-08 DIAGNOSIS — E559 Vitamin D deficiency, unspecified: Secondary | ICD-10-CM

## 2018-07-08 DIAGNOSIS — E782 Mixed hyperlipidemia: Secondary | ICD-10-CM

## 2018-07-08 DIAGNOSIS — R300 Dysuria: Secondary | ICD-10-CM

## 2018-07-08 DIAGNOSIS — Z0001 Encounter for general adult medical examination with abnormal findings: Secondary | ICD-10-CM | POA: Diagnosis not present

## 2018-07-08 DIAGNOSIS — I10 Essential (primary) hypertension: Secondary | ICD-10-CM

## 2018-07-08 DIAGNOSIS — R7303 Prediabetes: Secondary | ICD-10-CM | POA: Diagnosis not present

## 2018-07-08 LAB — POCT GLYCOSYLATED HEMOGLOBIN (HGB A1C): HEMOGLOBIN A1C: 6.3 % — AB (ref 4.0–5.6)

## 2018-07-08 MED ORDER — ROSUVASTATIN CALCIUM 5 MG PO TABS: 5.0000 mg | ORAL_TABLET | Freq: Every day | ORAL | 3 refills | Status: DC

## 2018-07-08 NOTE — Progress Notes (Signed)
Pt blood pressure elevated informed provider. 

## 2018-07-08 NOTE — Progress Notes (Signed)
[facility] [address] [place], [place] [address]  Internal MEDICINE  Office Visit Note  Patient Name: Ebony Hutchinson  [id number]  [id number]  Date of Service: 07/24/2018   Pt is here for routine health maintenance examination   Chief Complaint  Patient presents with  . Annual Exam    pt have some concerns about her weight gain  . Hypertension  . Quality Metric Gaps    pt plans to reschedule mammogram that was missed, colonoscopy due     The patient is here for health maintenance exam. The patient was started on atenolol 25mg  on her last visit. Blood pressure is still moderately elevated. However her heart rate has improved. She has no complaint of chest pain or chest pressure. Denies headache. She did have labs drawn prior to this visit. Cholesterol was moderately elevated and vitamin d was very low.     Current Medication: Outpatient Encounter Medications as of 07/08/2018  Medication Sig  . atenolol (TENORMIN) 25 MG tablet Take 1 tablet (25 mg total) by mouth daily.  . meloxicam (MOBIC) 7.5 MG tablet Take 1 tablet (7.5 mg total) by mouth daily.  Marland Kitchen tiZANidine (ZANAFLEX) 4 MG tablet Take 1 tablet (4 mg total) by mouth at bedtime as needed for muscle spasms.  . rosuvastatin (CRESTOR) 5 MG tablet Take 1 tablet (5 mg total) by mouth daily at 6 PM.   No facility-administered encounter medications on file as of 07/08/2018.     Surgical History: Past Surgical History:  Procedure Laterality Date  . ABDOMINAL HYSTERECTOMY  2011    Medical History: Past Medical History:  Diagnosis Date  . Hypertension     Family History: Family History  Problem Relation Age of Onset  . Colon cancer Mother        mom had colon cancer and it was removed  . Heart Problems Father   . Heart attack Brother       Review of Systems  Constitutional: Negative for activity change, chills, fatigue and unexpected weight change.  HENT: Negative for congestion, postnasal drip,  rhinorrhea, sneezing and sore throat.   Respiratory: Negative for cough, chest tightness, shortness of breath and wheezing.   Cardiovascular: Negative for chest pain and palpitations.       Elevated blood pressure  Gastrointestinal: Negative for abdominal pain, constipation, diarrhea, nausea and vomiting.  Endocrine: Negative for cold intolerance, heat intolerance, polydipsia and polyuria.  Musculoskeletal: Positive for back pain and myalgias. Negative for arthralgias, joint swelling and neck pain.  Skin: Negative for rash.  Allergic/Immunologic: Negative for environmental allergies.  Neurological: Positive for headaches. Negative for dizziness, tremors and numbness.  Hematological: Negative for adenopathy. Does not bruise/bleed easily.  Psychiatric/Behavioral: Negative for behavioral problems (Depression), sleep disturbance and suicidal ideas. The patient is not nervous/anxious.      Today's Vitals   07/08/18 1130  BP: (!) 158/86  Pulse: 84  Resp: 16  SpO2: 96%  Weight: 178 lb 6.4 oz (80.9 kg)  Height: 5\' 2"  (1.575 m)   Body mass index is 32.63 kg/m.   Physical Exam Vitals signs and nursing note reviewed.  Constitutional:      General: She is not in acute distress.    Appearance: Normal appearance. She is well-developed. She is not diaphoretic.  HENT:     Head: Normocephalic and atraumatic.     Mouth/Throat:     Pharynx: No oropharyngeal exudate.  Eyes:     Conjunctiva/sclera: Conjunctivae normal.     Pupils:  Pupils are equal, round, and reactive to light.  Neck:     Musculoskeletal: Normal range of motion and neck supple.     Thyroid: No thyromegaly.     Vascular: No carotid bruit or JVD.     Trachea: No tracheal deviation.  Cardiovascular:     Rate and Rhythm: Normal rate and regular rhythm.     Pulses: Normal pulses.     Heart sounds: Normal heart sounds. No murmur. No friction rub. No gallop.      Comments: Mild tachycardia Pulmonary:     Effort: Pulmonary  effort is normal. No respiratory distress.     Breath sounds: Normal breath sounds. No wheezing or rales.  Chest:     Chest wall: No tenderness.     Breasts:        Right: Normal. No swelling, bleeding, inverted nipple, mass, nipple discharge, skin change or tenderness.        Left: Normal. No swelling, bleeding, inverted nipple, mass, nipple discharge, skin change or tenderness.  Abdominal:     General: Bowel sounds are normal.     Palpations: Abdomen is soft.     Tenderness: There is no abdominal tenderness.  Musculoskeletal: Normal range of motion.        General: Tenderness present.     Comments: Moderate lower back pain, radiates into the hips and down the back of the legs. Pain more severe when sitting for long periods of time. Exertion also increases discomfort. No visible or palpable abnormalities noted at this time.   Lymphadenopathy:     Cervical: No cervical adenopathy.  Skin:    General: Skin is warm and dry.  Neurological:     Mental Status: She is alert and oriented to person, place, and time.     Cranial Nerves: No cranial nerve deficit.  Psychiatric:        Behavior: Behavior normal.        Thought Content: Thought content normal.        Judgment: Judgment normal.      LABS: Recent Results (from the past 2160 hour(s))  CBC     Status: None   Collection Time: 06/25/18  8:40 AM  Result Value Ref Range   WBC 5.3 4.0 - 10.5 K/uL   RBC 4.76 3.87 - 5.11 MIL/uL   Hemoglobin 14.0 12.0 - 15.0 g/dL   HCT 16.1 09.6 - 04.5 %   MCV 88.4 80.0 - 100.0 fL   MCH 29.4 26.0 - 34.0 pg   MCHC 33.3 30.0 - 36.0 g/dL   RDW 40.9 81.1 - 91.4 %   Platelets 250 150 - 400 K/uL   nRBC 0.0 0.0 - 0.2 %    Comment: Performed at [facility], [address] [place], [place] [address]  Comprehensive metabolic panel     Status: Abnormal   Collection Time: 06/25/18  8:40 AM  Result Value Ref Range   Sodium 141 135 - 145 mmol/L   Potassium 4.0 3.5 - 5.1 mmol/L   Chloride 110 98  - 111 mmol/L   CO2 22 22 - 32 mmol/L   Glucose, Bld 142 (H) 70 - 99 mg/dL   BUN 14 8 - 23 mg/dL   Creatinine, Ser 6.21 0.44 - 1.00 mg/dL   Calcium 9.0 8.9 - 30.8 mg/dL   Total Protein 7.6 6.5 - 8.1 g/dL   Albumin 4.1 3.5 - 5.0 g/dL   AST 25 15 - 41 U/L   ALT 29 0 -  44 U/L   Alkaline Phosphatase 88 38 - 126 U/L   Total Bilirubin 0.6 0.3 - 1.2 mg/dL   GFR calc non Af Amer >60 >60 mL/min   GFR calc Af Amer >60 >60 mL/min   Anion gap 9 5 - 15    Comment: Performed at [facility], [address] [place], [place] [address]  Lipid panel     Status: Abnormal   Collection Time: 06/25/18  8:40 AM  Result Value Ref Range   Cholesterol 287 (H) 0 - 200 mg/dL   Triglycerides 604203 (H) <150 mg/dL   HDL 49 >54>40 mg/dL   Total CHOL/HDL Ratio 5.9 RATIO   VLDL 41 (H) 0 - 40 mg/dL   LDL Cholesterol 098197 (H) 0 - 99 mg/dL    Comment:        Total Cholesterol/HDL:CHD Risk Coronary Heart Disease Risk Table                     Men   Women  1/2 Average Risk   3.4   3.3  Average Risk       5.0   4.4  2 X Average Risk   9.6   7.1  3 X Average Risk  23.4   11.0        Use the calculated Patient Ratio above and the CHD Risk Table to determine the patient's CHD Risk.        ATP III CLASSIFICATION (LDL):  <100     mg/dL   Optimal  119-147100-129  mg/dL   Near or Above                    Optimal  130-159  mg/dL   Borderline  [id number]  mg/dL   High  >130>190     mg/dL   Very High Performed at [facility], [address] [place], [place] [address]   TSH     Status: None   Collection Time: 06/25/18  8:40 AM  Result Value Ref Range   TSH 2.719 0.350 - 4.500 uIU/mL    Comment: Performed by a 3rd Generation assay with a functional sensitivity of <=0.01 uIU/mL. Performed at [facility], [address] [place], [place] [address]   T4, free     Status: None   Collection Time: 06/25/18  8:40 AM  Result Value Ref Range   Free T4 0.84 0.82 - 1.77 ng/dL    Comment: (NOTE) Biotin  ingestion may interfere with free T4 tests. If the results are inconsistent with the TSH level, previous test results, or the clinical presentation, then consider biotin interference. If needed, order repeat testing after stopping biotin. Performed at [facility], [address] [place], [place] [address]   VITAMIN D 25 Hydroxy (Vit-D Deficiency, Fractures)     Status: Abnormal   Collection Time: 06/25/18  8:40 AM  Result Value Ref Range   Vit D, 25-Hydroxy 5.2 (L) 30.0 - 100.0 ng/mL    Comment: (NOTE) Vitamin D deficiency has been defined by the Institute of Medicine and an Endocrine Society practice guideline as a level of serum 25-OH vitamin D less than 20 ng/mL (1,2). The Endocrine Society went on to further define vitamin D insufficiency as a level between 21 and 29 ng/mL (2). 1. IOM (Institute of Medicine). 2010. Dietary reference   intakes for calcium and D. [place]: The   [employer]. 2. Holick MF, Binkley Flora, Bischoff-Ferrari HA,  et al.   Evaluation, treatment, and prevention of vitamin D   deficiency: an Endocrine Society clinical practice   guideline. JCEM. 2011 Jul; 96(7):1911-30. Performed At: [facility] [address] [place], [place] [id number] Jolene Schimke MD OZ:[id number]   UA/M w/rflx Culture, Routine     Status: Abnormal   Collection Time: 07/08/18 12:00 AM  Result Value Ref Range   Specific Gravity, UA      >=1.030 (A) 1.005 - 1.030   pH, UA 5.0 5.0 - 7.5   Color, UA Yellow Yellow   Appearance Ur Clear Clear   Leukocytes, UA Negative Negative   Protein, UA Negative Negative/Trace   Glucose, UA 3+ (A) Negative   Ketones, UA Negative Negative   RBC, UA Negative Negative   Bilirubin, UA Negative Negative   Urobilinogen, Ur 0.2 0.2 - 1.0 mg/dL   Nitrite, UA Negative Negative   Microscopic Examination Comment     Comment: Microscopic follows if indicated.   Microscopic Examination See below:     Comment:  Microscopic was indicated and was performed.   Urinalysis Reflex Comment     Comment: This specimen will not reflex to a Urine Culture.  Microscopic Examination     Status: None   Collection Time: 07/08/18 12:00 AM  Result Value Ref Range   WBC, UA 0-5 0 - 5 /hpf   RBC, UA 0-2 0 - 2 /hpf   Epithelial Cells (non renal) 0-10 0 - 10 /hpf   Casts None seen None seen /lpf   Bacteria, UA Few None seen/Few  POCT HgB A1C     Status: Abnormal   Collection Time: 07/08/18 12:04 PM  Result Value Ref Range   Hemoglobin A1C 6.3 (A) 4.0 - 5.6 %   HbA1c POC (<> result, manual entry)     HbA1c, POC (prediabetic range)     HbA1c, POC (controlled diabetic range)     Assessment/Plan:   1. Encounter for general adult medical examination with abnormal findings Annual health maintenance exam today.   2. Essential hypertension Increase atenolol to two tablets po QPM  3. Mixed hyperlipidemia Start crestor 5mg  every evening. Prudent diet discussed with the patient. Will recheck lipid panel in three months.  - rosuvastatin (CRESTOR) 5 MG tablet; Take 1 tablet (5 mg total) by mouth daily at 6 PM.  Dispense: 30 tablet; Refill: 3  4. Prediabetes - POCT HgB A1C 6.3 today. Prudent diet discussed with patient and recommended she increase routine exercise to lower blood sugar without starting medication for diabetes.   5. Vitamin D deficiency Patient started on drisdol 50000iu weekly   6. Dysuria - UA/M w/rflx Culture, Routine  General Counseling: Neima verbalizes understanding of the findings of todays visit and agrees with plan of treatment. I have discussed any further diagnostic evaluation that may be needed or ordered today. We also reviewed her medications today. she has been encouraged to call the office with any questions or concerns that should arise related to todays visit.    Counseling:  Diabetes Counseling:  1. Addition of ACE inh/ ARB'S for nephroprotection. Microalbumin is updated  2.  Diabetic foot care, prevention of complications. Podiatry consult 3. Exercise and lose weight.  4. Diabetic eye examination, Diabetic eye exam is updated  5. Monitor blood sugar closlely. nutrition counseling.  6. Sign and symptoms of hypoglycemia including shaking sweating,confusion and headaches.  Hypertension Counseling:   The following hypertensive lifestyle modification were recommended and discussed:  1. Limiting alcohol intake to less  than 1 oz/day of ethanol:(24 oz of beer or 8 oz of wine or 2 oz of 100-proof whiskey). 2. Take baby ASA 81 mg daily. 3. Importance of regular aerobic exercise and losing weight. 4. Reduce dietary saturated fat and cholesterol intake for overall cardiovascular health. 5. Maintaining adequate dietary potassium, calcium, and magnesium intake. 6. Regular monitoring of the blood pressure. 7. Reduce sodium intake to less than 100 mmol/day (less than 2.3 gm of sodium or less than 6 gm of sodium choride)   This patient was seen by Vincent Gros FNP Collaboration with Dr Lyndon Code as a part of collaborative care agreement  Orders Placed This Encounter  Procedures  . Microscopic Examination  . UA/M w/rflx Culture, Routine  . POCT HgB A1C    Meds ordered this encounter  Medications  . rosuvastatin (CRESTOR) 5 MG tablet    Sig: Take 1 tablet (5 mg total) by mouth daily at 6 PM.    Dispense:  30 tablet    Refill:  3    Order Specific Question:   Supervising Provider    Answer:   Lyndon Code [[id number]]    Time spent: 68 Minutes      Lyndon Code, MD  Internal Medicine

## 2018-07-09 LAB — UA/M W/RFLX CULTURE, ROUTINE
Bilirubin, UA: NEGATIVE
KETONES UA: NEGATIVE
LEUKOCYTES UA: NEGATIVE
Nitrite, UA: NEGATIVE
PH UA: 5 (ref 5.0–7.5)
PROTEIN UA: NEGATIVE
RBC, UA: NEGATIVE
UUROB: 0.2 mg/dL (ref 0.2–1.0)

## 2018-07-09 LAB — MICROSCOPIC EXAMINATION: CASTS: NONE SEEN /LPF

## 2018-07-22 ENCOUNTER — Encounter: Payer: Self-pay | Admitting: Nurse Practitioner

## 2018-07-22 ENCOUNTER — Ambulatory Visit: Payer: BLUE CROSS/BLUE SHIELD | Admitting: Nurse Practitioner

## 2018-07-22 ENCOUNTER — Other Ambulatory Visit: Payer: Self-pay

## 2018-07-22 VITALS — BP 178/86 | HR 74 | Resp 16 | Ht 62.0 in | Wt 178.2 lb

## 2018-07-22 DIAGNOSIS — I10 Essential (primary) hypertension: Secondary | ICD-10-CM

## 2018-07-22 MED ORDER — HYDROCHLOROTHIAZIDE 12.5 MG PO TABS: 12.5000 mg | ORAL_TABLET | Freq: Every day | ORAL | 3 refills | Status: DC

## 2018-07-22 NOTE — Progress Notes (Signed)
[facility] [address] [place], [place] [address]  Internal MEDICINE  Office Visit Note  Patient Name: Ebony Hutchinson  [id number]  [id number]  Date of Service: 07/26/2018  Chief Complaint  Patient presents with  . Medical Management of Chronic Issues    discuss weight management  . Hypertension    2wk follow up    Blood pressure elevated today. Currently taking atenolol 25mg  every evening. Has not missed any doses. Heart rate is good. She states she feels good. Has increased exercise. She has been walking three times per week for over an hour each time.       Current Medication: Outpatient Encounter Medications as of 07/22/2018  Medication Sig  . atenolol (TENORMIN) 25 MG tablet Take 1 tablet (25 mg total) by mouth daily.  . meloxicam (MOBIC) 7.5 MG tablet Take 1 tablet (7.5 mg total) by mouth daily.  . rosuvastatin (CRESTOR) 5 MG tablet Take 1 tablet (5 mg total) by mouth daily at 6 PM.  . tiZANidine (ZANAFLEX) 4 MG tablet Take 1 tablet (4 mg total) by mouth at bedtime as needed for muscle spasms.  . hydrochlorothiazide (HYDRODIURIL) 12.5 MG tablet Take 1 tablet (12.5 mg total) by mouth daily.   No facility-administered encounter medications on file as of 07/22/2018.     Surgical History: Past Surgical History:  Procedure Laterality Date  . ABDOMINAL HYSTERECTOMY  2011    Medical History: Past Medical History:  Diagnosis Date  . Hypertension     Family History: Family History  Problem Relation Age of Onset  . Colon cancer Mother        mom had colon cancer and it was removed  . Heart Problems Father   . Heart attack Brother     Social History   Socioeconomic History  . Marital status: Divorced    Spouse name: Not on file  . Number of children: Not on file  . Years of education: Not on file  . Highest education level: Not on file  Occupational History  . Not on file  Social Needs  . Financial resource strain: Not on file  . Food  insecurity:    Worry: Not on file    Inability: Not on file  . Transportation needs:    Medical: Not on file    Non-medical: Not on file  Tobacco Use  . Smoking status: Never Smoker  . Smokeless tobacco: Never Used  Substance and Sexual Activity  . Alcohol use: Yes    Comment: occasionally  . Drug use: No  . Sexual activity: Not on file  Lifestyle  . Physical activity:    Days per week: Not on file    Minutes per session: Not on file  . Stress: Not on file  Relationships  . Social connections:    Talks on phone: Not on file    Gets together: Not on file    Attends religious service: Not on file    Active member of club or organization: Not on file    Attends meetings of clubs or organizations: Not on file    Relationship status: Not on file  . Intimate partner violence:    Fear of current or ex partner: Not on file    Emotionally abused: Not on file    Physically abused: Not on file    Forced sexual activity: Not on file  Other Topics Concern  . Not on file  Social History Narrative  . Not on file  Review of Systems  Constitutional: Negative for activity change, chills, fatigue and unexpected weight change.  HENT: Negative for congestion, postnasal drip, rhinorrhea, sneezing and sore throat.   Respiratory: Negative for cough, chest tightness, shortness of breath and wheezing.   Cardiovascular: Negative for chest pain and palpitations.       Elevated blood pressure  Gastrointestinal: Negative for abdominal pain, constipation, diarrhea, nausea and vomiting.  Endocrine: Negative for cold intolerance, heat intolerance, polydipsia and polyuria.  Musculoskeletal: Positive for back pain and myalgias. Negative for arthralgias, joint swelling and neck pain.  Skin: Negative for rash.  Allergic/Immunologic: Negative for environmental allergies.  Neurological: Positive for headaches. Negative for dizziness, tremors and numbness.  Hematological: Negative for adenopathy.  Does not bruise/bleed easily.  Psychiatric/Behavioral: Negative for behavioral problems (Depression), sleep disturbance and suicidal ideas. The patient is not nervous/anxious.     Today's Vitals   07/22/18 1400  BP: (!) 178/86  Pulse: 74  Resp: 16  SpO2: 97%  Weight: 178 lb 3.2 oz (80.8 kg)  Height: 5\' 2"  (1.575 m)   Body mass index is 32.59 kg/m.  Physical Exam Vitals signs and nursing note reviewed.  Constitutional:      General: She is not in acute distress.    Appearance: Normal appearance. She is well-developed. She is not diaphoretic.  HENT:     Head: Normocephalic and atraumatic.     Mouth/Throat:     Pharynx: No oropharyngeal exudate.  Eyes:     Conjunctiva/sclera: Conjunctivae normal.     Pupils: Pupils are equal, round, and reactive to light.  Neck:     Musculoskeletal: Normal range of motion and neck supple.     Thyroid: No thyromegaly.     Vascular: No carotid bruit or JVD.     Trachea: No tracheal deviation.  Cardiovascular:     Rate and Rhythm: Normal rate and regular rhythm.     Heart sounds: Normal heart sounds. No murmur. No friction rub. No gallop.      Comments: Mild tachycardia Pulmonary:     Effort: Pulmonary effort is normal. No respiratory distress.     Breath sounds: Normal breath sounds. No wheezing or rales.  Chest:     Chest wall: No tenderness.  Abdominal:     General: Bowel sounds are normal.     Palpations: Abdomen is soft.  Musculoskeletal: Normal range of motion.        General: Tenderness present.     Comments: Moderate lower back pain, radiates into the hips and down the back of the legs. Pain more severe when sitting for long periods of time. Exertion also increases discomfort. No visible or palpable abnormalities noted at this time.   Lymphadenopathy:     Cervical: No cervical adenopathy.  Skin:    General: Skin is warm and dry.  Neurological:     Mental Status: She is alert and oriented to person, place, and time.     Cranial  Nerves: No cranial nerve deficit.  Psychiatric:        Behavior: Behavior normal.        Thought Content: Thought content normal.        Judgment: Judgment normal.   Assessment/Plan:  1. Essential hypertension Add HCTZ 12.5mg  tablets. Take atenolol as previously prescribed. Follow DASH diet and incorporate exercise into daily routine.  - hydrochlorothiazide (HYDRODIURIL) 12.5 MG tablet; Take 1 tablet (12.5 mg total) by mouth daily.  Dispense: 30 tablet; Refill: 3   General Counseling: Manami verbalizes  understanding of the findings of todays visit and agrees with plan of treatment. I have discussed any further diagnostic evaluation that may be needed or ordered today. We also reviewed her medications today. she has been encouraged to call the office with any questions or concerns that should arise related to todays visit.  This patient was seen by Vincent GrosHeather Kimia Finan FNP Collaboration with Dr Lyndon CodeFozia M Khan as a part of collaborative care agreement  Meds ordered this encounter  Medications  . hydrochlorothiazide (HYDRODIURIL) 12.5 MG tablet    Sig: Take 1 tablet (12.5 mg total) by mouth daily.    Dispense:  30 tablet    Refill:  3    Order Specific Question:   Supervising Provider    Answer:   Lyndon CodeKHAN, FOZIA M [[id number]]    Time spent: 5815 Minutes      Dr Lyndon CodeFozia M Khan Internal medicine

## 2018-07-22 NOTE — Progress Notes (Signed)
Pt blood pressure elevated. 1st reading: 182/95 2nd reading 178/86 Informed provider

## 2018-07-24 DIAGNOSIS — R7303 Prediabetes: Secondary | ICD-10-CM | POA: Insufficient documentation

## 2018-07-24 DIAGNOSIS — E782 Mixed hyperlipidemia: Secondary | ICD-10-CM | POA: Insufficient documentation

## 2018-07-24 DIAGNOSIS — E559 Vitamin D deficiency, unspecified: Secondary | ICD-10-CM | POA: Insufficient documentation

## 2018-07-24 DIAGNOSIS — R300 Dysuria: Secondary | ICD-10-CM | POA: Insufficient documentation

## 2018-07-30 ENCOUNTER — Other Ambulatory Visit: Payer: Self-pay

## 2018-07-30 DIAGNOSIS — M545 Low back pain, unspecified: Secondary | ICD-10-CM

## 2018-07-30 DIAGNOSIS — I10 Essential (primary) hypertension: Secondary | ICD-10-CM

## 2018-07-30 MED ORDER — ATENOLOL 25 MG PO TABS: 25.0000 mg | ORAL_TABLET | Freq: Every day | ORAL | 3 refills | Status: DC

## 2018-07-30 MED ORDER — TIZANIDINE HCL 4 MG PO TABS: 4.0000 mg | ORAL_TABLET | Freq: Every evening | ORAL | 1 refills | Status: DC | PRN

## 2018-08-06 ENCOUNTER — Ambulatory Visit: Payer: BLUE CROSS/BLUE SHIELD | Admitting: Nurse Practitioner

## 2018-08-06 ENCOUNTER — Other Ambulatory Visit: Payer: Self-pay

## 2018-08-22 ENCOUNTER — Other Ambulatory Visit: Payer: Self-pay

## 2018-08-22 DIAGNOSIS — M545 Low back pain, unspecified: Secondary | ICD-10-CM

## 2018-08-22 MED ORDER — TIZANIDINE HCL 4 MG PO TABS: 4.0000 mg | ORAL_TABLET | Freq: Every evening | ORAL | 1 refills | Status: DC | PRN

## 2018-09-05 ENCOUNTER — Ambulatory Visit: Payer: Self-pay | Admitting: Nurse Practitioner

## 2018-10-14 ENCOUNTER — Other Ambulatory Visit: Payer: Self-pay

## 2018-10-14 DIAGNOSIS — I10 Essential (primary) hypertension: Secondary | ICD-10-CM

## 2018-10-14 MED ORDER — HYDROCHLOROTHIAZIDE 12.5 MG PO TABS: 12.5000 mg | ORAL_TABLET | Freq: Every day | ORAL | 3 refills | Status: DC

## 2018-10-15 ENCOUNTER — Other Ambulatory Visit: Payer: Self-pay

## 2018-10-15 DIAGNOSIS — E782 Mixed hyperlipidemia: Secondary | ICD-10-CM

## 2018-10-15 MED ORDER — ROSUVASTATIN CALCIUM 5 MG PO TABS: 5.0000 mg | ORAL_TABLET | Freq: Every day | ORAL | 3 refills | Status: DC

## 2018-10-29 ENCOUNTER — Other Ambulatory Visit: Payer: Self-pay | Admitting: Nurse Practitioner

## 2018-10-29 DIAGNOSIS — I10 Essential (primary) hypertension: Secondary | ICD-10-CM

## 2018-10-29 MED ORDER — ATENOLOL 25 MG PO TABS: 25.0000 mg | ORAL_TABLET | Freq: Every day | ORAL | 3 refills | Status: DC

## 2018-11-08 ENCOUNTER — Other Ambulatory Visit: Payer: Self-pay | Admitting: Nurse Practitioner

## 2018-11-08 DIAGNOSIS — M545 Low back pain, unspecified: Secondary | ICD-10-CM

## 2018-11-11 NOTE — Telephone Encounter (Signed)
Pt missed last 2 appt

## 2019-02-03 ENCOUNTER — Other Ambulatory Visit: Payer: Self-pay

## 2019-02-03 DIAGNOSIS — I10 Essential (primary) hypertension: Secondary | ICD-10-CM

## 2019-02-03 MED ORDER — ATENOLOL 25 MG PO TABS: 25.0000 mg | ORAL_TABLET | Freq: Every day | ORAL | 1 refills | Status: DC

## 2019-03-17 ENCOUNTER — Other Ambulatory Visit: Payer: Self-pay

## 2019-03-17 DIAGNOSIS — I10 Essential (primary) hypertension: Secondary | ICD-10-CM

## 2019-03-17 MED ORDER — ATENOLOL 25 MG PO TABS: 25.0000 mg | ORAL_TABLET | Freq: Every day | ORAL | 0 refills | Status: DC

## 2019-03-31 ENCOUNTER — Telehealth: Payer: Self-pay

## 2019-03-31 NOTE — Telephone Encounter (Signed)
Confirmed appointment with patient. klh °

## 2019-04-01 ENCOUNTER — Other Ambulatory Visit: Payer: Self-pay

## 2019-04-01 ENCOUNTER — Encounter: Payer: Self-pay | Admitting: Adult Health

## 2019-04-01 ENCOUNTER — Ambulatory Visit: Payer: BC Managed Care – PPO | Admitting: Adult Health

## 2019-04-01 VITALS — BP 142/88 | HR 76 | Temp 97.1°F | Resp 16 | Ht 62.0 in | Wt 177.0 lb

## 2019-04-01 DIAGNOSIS — I10 Essential (primary) hypertension: Secondary | ICD-10-CM

## 2019-04-01 DIAGNOSIS — E782 Mixed hyperlipidemia: Secondary | ICD-10-CM | POA: Diagnosis not present

## 2019-04-01 MED ORDER — HYDROCHLOROTHIAZIDE 12.5 MG PO TABS: 12.5000 mg | ORAL_TABLET | Freq: Every day | ORAL | 3 refills | Status: DC

## 2019-04-01 MED ORDER — ATENOLOL 25 MG PO TABS: 25.0000 mg | ORAL_TABLET | Freq: Every day | ORAL | 0 refills | Status: DC

## 2019-04-01 NOTE — Progress Notes (Signed)
[facility] [address] [place], [place] [address]  Internal MEDICINE  Office Visit Note  Patient Name: Ebony Hutchinson  [id number]  [id number]  Date of Service: 04/06/2019  Chief Complaint  Patient presents with  . Hypertension    bp has been running high with headache     HPI Patient here for routine follow-up on her blood pressure. Reports that she has been out of her HCTZ medication for 2 months and since then she feels as though her BP has been steadily rising. Has been experiencing a dull headache for the last several weeks but last night it was a more intense headache at the back of her head. Monitors her BP at home and readings have been SBP in the 180's. Felt an urgency since her headache has worsened and her home BP readings have been elevated. Denies chest pain, shortness of breath or palpitations. Patient also reports that she has stopped taking her Crestor due to unwanted side effects of dizziness. Discussed with patient the risks of untreated elevated cholesterol. Also discussed a non-statin medication Zetia that can also control high cholesterol, patient refused all therapy at this time as she would like to focus on controlling her blood pressure at this time.    Current Medication: Outpatient Encounter Medications as of 04/01/2019  Medication Sig  . atenolol (TENORMIN) 25 MG tablet Take 1 tablet (25 mg total) by mouth daily.  . hydrochlorothiazide (HYDRODIURIL) 12.5 MG tablet Take 1 tablet (12.5 mg total) by mouth daily.  . rosuvastatin (CRESTOR) 5 MG tablet Take 1 tablet (5 mg total) by mouth daily at 6 PM.  . tiZANidine (ZANAFLEX) 4 MG tablet Take 1 tablet (4 mg total) by mouth at bedtime as needed for muscle spasms.  . [DISCONTINUED] atenolol (TENORMIN) 25 MG tablet Take 1 tablet (25 mg total) by mouth daily.  . [DISCONTINUED] hydrochlorothiazide (HYDRODIURIL) 12.5 MG tablet Take 1 tablet (12.5 mg total) by mouth daily.  . [DISCONTINUED] meloxicam (MOBIC)  7.5 MG tablet Take 1 tablet (7.5 mg total) by mouth daily. (Patient not taking: Reported on 04/01/2019)   No facility-administered encounter medications on file as of 04/01/2019.    Surgical History: Past Surgical History:  Procedure Laterality Date  . ABDOMINAL HYSTERECTOMY  2011    Medical History: Past Medical History:  Diagnosis Date  . Hypertension     Family History: Family History  Problem Relation Age of Onset  . Colon cancer Mother        mom had colon cancer and it was removed  . Heart Problems Father   . Heart attack Brother     Social History   Socioeconomic History  . Marital status: Divorced    Spouse name: Not on file  . Number of children: Not on file  . Years of education: Not on file  . Highest education level: Not on file  Occupational History  . Not on file  Tobacco Use  . Smoking status: Never Smoker  . Smokeless tobacco: Never Used  Substance and Sexual Activity  . Alcohol use: Yes    Comment: occasionally  . Drug use: No  . Sexual activity: Not on file  Other Topics Concern  . Not on file  Social History Narrative  . Not on file   Social Determinants of Health   Financial Resource Strain:   . Difficulty of Paying Living Expenses: Not on file  Food Insecurity:   . Worried About Programme researcher, broadcasting/film/video in the Last Year:  Not on file  . Ran Out of Food in the Last Year: Not on file  Transportation Needs:   . Lack of Transportation (Medical): Not on file  . Lack of Transportation (Non-Medical): Not on file  Physical Activity:   . Days of Exercise per Week: Not on file  . Minutes of Exercise per Session: Not on file  Stress:   . Feeling of Stress : Not on file  Social Connections:   . Frequency of Communication with Friends and Family: Not on file  . Frequency of Social Gatherings with Friends and Family: Not on file  . Attends Religious Services: Not on file  . Active Member of Clubs or Organizations: Not on file  . Attends Theatre manager Meetings: Not on file  . Marital Status: Not on file  Intimate Partner Violence:   . Fear of Current or Ex-Partner: Not on file  . Emotionally Abused: Not on file  . Physically Abused: Not on file  . Sexually Abused: Not on file      Review of Systems  Constitutional: Negative for chills, fatigue and unexpected weight change.  HENT: Negative for congestion, rhinorrhea, sneezing and sore throat.   Eyes: Negative for photophobia, pain and redness.  Respiratory: Negative for cough, chest tightness and shortness of breath.   Cardiovascular: Negative for chest pain and palpitations.  Gastrointestinal: Negative for abdominal pain, constipation, diarrhea, nausea and vomiting.  Endocrine: Negative.   Genitourinary: Negative for dysuria and frequency.  Musculoskeletal: Negative for arthralgias, back pain, joint swelling and neck pain.  Skin: Negative for rash.  Allergic/Immunologic: Negative.   Neurological: Positive for headaches. Negative for tremors and numbness.  Hematological: Negative for adenopathy. Does not bruise/bleed easily.  Psychiatric/Behavioral: Negative for behavioral problems and sleep disturbance. The patient is not nervous/anxious.     Vital Signs: BP (!) 142/88   Pulse 76   Temp (!) 97.1 F (36.2 C)   Resp 16   Ht 5\' 2"  (1.575 m)   Wt 177 lb (80.3 kg)   SpO2 98%   BMI 32.37 kg/m    Physical Exam Vitals signs and nursing note reviewed.  Constitutional:      General: She is not in acute distress.    Appearance: She is well-developed. She is not diaphoretic.  HENT:     Head: Normocephalic and atraumatic.     Mouth/Throat:     Pharynx: No oropharyngeal exudate.  Eyes:     Pupils: Pupils are equal, round, and reactive to light.  Neck:     Musculoskeletal: Normal range of motion and neck supple.     Thyroid: No thyromegaly.     Vascular: No JVD.     Trachea: No tracheal deviation.  Cardiovascular:     Rate and Rhythm: Normal rate and regular  rhythm.     Heart sounds: Normal heart sounds. No murmur. No friction rub. No gallop.   Pulmonary:     Effort: Pulmonary effort is normal. No respiratory distress.     Breath sounds: Normal breath sounds. No wheezing or rales.  Chest:     Chest wall: No tenderness.  Abdominal:     Palpations: Abdomen is soft.     Tenderness: There is no abdominal tenderness. There is no guarding.  Musculoskeletal: Normal range of motion.  Lymphadenopathy:     Cervical: No cervical adenopathy.  Skin:    General: Skin is warm and dry.  Neurological:     Mental Status: She is alert and oriented  to person, place, and time.     Cranial Nerves: No cranial nerve deficit.  Psychiatric:        Behavior: Behavior normal.        Thought Content: Thought content normal.        Judgment: Judgment normal.     Assessment/Plan: 1. Essential hypertension Refill of HCTZ. Plan for patient to monitor BP readings at home on both Atenolol and HCTZ. To call office with BP readings in 2 weeks for possible titration or addition of further medication. Scheduled to be seen back in office in 4 weeks to monitor BP and continue with stabilizing BP. - hydrochlorothiazide (HYDRODIURIL) 12.5 MG tablet; Take 1 tablet (12.5 mg total) by mouth daily.  Dispense: 30 tablet; Refill: 3 - atenolol (TENORMIN) 25 MG tablet; Take 1 tablet (25 mg total) by mouth daily.  Dispense: 30 tablet; Refill: 0 - Basic Metabolic Panel (BMET)  2. Mixed hyperlipidemia Patient refusing statin therapy at this time, as well as other non-statin therapies. Discussed risks of untreated hyperlipidemia, such as increased risk of stroke and heart attack. At this time patient refusing any treatment for hyperlipidemia as she wants to focus on managing her BP at this time. Encouraged patient to research Zetia and diet and exercise programs that will help lower cholesterol levels.  General Counseling: Ebony Hutchinson verbalizes understanding of the findings of todays visit and  agrees with plan of treatment. I have discussed any further diagnostic evaluation that may be needed or ordered today. We also reviewed her medications today. she has been encouraged to call the office with any questions or concerns that should arise related to todays visit. Hypertension Counseling:   The following hypertensive lifestyle modification were recommended and discussed:  1. Limiting alcohol intake to less than 1 oz/day of ethanol:(24 oz of beer or 8 oz of wine or 2 oz of 100-proof whiskey). 2. Take baby ASA 81 mg daily. 3. Importance of regular aerobic exercise and losing weight. 4. Reduce dietary saturated fat and cholesterol intake for overall cardiovascular health. 5. Maintaining adequate dietary potassium, calcium, and magnesium intake. 6. Regular monitoring of the blood pressure. 7. Reduce sodium intake to less than 100 mmol/day (less than 2.3 gm of sodium or less than 6 gm of sodium choride)     Orders Placed This Encounter  Procedures  . Basic Metabolic Panel (BMET)    Meds ordered this encounter  Medications  . hydrochlorothiazide (HYDRODIURIL) 12.5 MG tablet    Sig: Take 1 tablet (12.5 mg total) by mouth daily.    Dispense:  30 tablet    Refill:  3  . atenolol (TENORMIN) 25 MG tablet    Sig: Take 1 tablet (25 mg total) by mouth daily.    Dispense:  30 tablet    Refill:  0    Pt need appt for further refills    Time spent: 25 Minutes   This patient was seen by Blima LedgerAdam Sherald Balbuena AGNP-C in Collaboration with Dr Lyndon CodeFozia M Khan as a part of collaborative care agreement     Johnna AcostaAdam J. Nylan Nevel AGNP-C Internal medicine

## 2019-04-08 ENCOUNTER — Other Ambulatory Visit: Payer: Self-pay

## 2019-04-08 DIAGNOSIS — I10 Essential (primary) hypertension: Secondary | ICD-10-CM

## 2019-04-08 MED ORDER — ATENOLOL 25 MG PO TABS: 25.0000 mg | ORAL_TABLET | Freq: Every day | ORAL | 5 refills | Status: DC

## 2019-04-09 ENCOUNTER — Other Ambulatory Visit: Payer: Self-pay

## 2019-04-09 ENCOUNTER — Ambulatory Visit: Payer: BC Managed Care – PPO | Attending: Internal Medicine

## 2019-04-09 DIAGNOSIS — Z20828 Contact with and (suspected) exposure to other viral communicable diseases: Secondary | ICD-10-CM | POA: Diagnosis not present

## 2019-04-09 DIAGNOSIS — Z20822 Contact with and (suspected) exposure to covid-19: Secondary | ICD-10-CM

## 2019-04-10 ENCOUNTER — Ambulatory Visit: Payer: BLUE CROSS/BLUE SHIELD | Admitting: Nurse Practitioner

## 2019-04-10 NOTE — Progress Notes (Signed)
Order(s) created erroneously. Erroneous order ID: [id number]  Order moved by: Brigitte Pulse  Order move date/time: 04/10/2019 6:47 PM  Source Patient: [id number]  Source Contact: 04/09/2019  Destination Patient: [id number]  Destination Contact: 04/09/2019

## 2019-04-10 NOTE — Progress Notes (Signed)
Order(s) created erroneously. Erroneous order ID: [id number]  Order moved by: Woodson Macha M  Order move date/time: 04/10/2019 6:47 PM  Source Patient: [id number]  Source Contact: 04/09/2019  Destination Patient: [id number]  Destination Contact: 04/09/2019 

## 2019-04-10 NOTE — Progress Notes (Signed)
Moving orders to this encounter.  

## 2019-04-11 LAB — NOVEL CORONAVIRUS, NAA: SARS-CoV-2, NAA: NOT DETECTED

## 2019-04-24 ENCOUNTER — Telehealth: Payer: Self-pay

## 2019-04-24 NOTE — Telephone Encounter (Signed)
Confirmed appointment with patient. klh °

## 2019-04-29 ENCOUNTER — Ambulatory Visit: Payer: BC Managed Care – PPO | Admitting: Adult Health

## 2019-04-29 DIAGNOSIS — Z008 Encounter for other general examination: Secondary | ICD-10-CM

## 2019-05-02 ENCOUNTER — Telehealth: Payer: Self-pay

## 2019-05-02 NOTE — Telephone Encounter (Signed)
Billed patient missed appointment fee 04/29/2019

## 2019-07-31 ENCOUNTER — Telehealth: Payer: Self-pay

## 2019-07-31 NOTE — Telephone Encounter (Signed)
Confirmed and screened for 08-04-19 ov. 

## 2019-08-04 ENCOUNTER — Encounter: Payer: Self-pay | Admitting: Nurse Practitioner

## 2019-08-04 ENCOUNTER — Other Ambulatory Visit: Payer: Self-pay

## 2019-08-04 ENCOUNTER — Ambulatory Visit: Payer: 59 | Admitting: Nurse Practitioner

## 2019-08-04 VITALS — BP 120/69 | HR 79 | Temp 97.3°F | Resp 16 | Ht 62.0 in | Wt 170.2 lb

## 2019-08-04 DIAGNOSIS — Z1211 Encounter for screening for malignant neoplasm of colon: Secondary | ICD-10-CM

## 2019-08-04 DIAGNOSIS — E782 Mixed hyperlipidemia: Secondary | ICD-10-CM

## 2019-08-04 DIAGNOSIS — Z23 Encounter for immunization: Secondary | ICD-10-CM

## 2019-08-04 DIAGNOSIS — I10 Essential (primary) hypertension: Secondary | ICD-10-CM

## 2019-08-04 DIAGNOSIS — Z1231 Encounter for screening mammogram for malignant neoplasm of breast: Secondary | ICD-10-CM

## 2019-08-04 MED ORDER — TETANUS-DIPHTH-ACELL PERTUSSIS 5-2.5-18.5 LF-MCG/0.5 IM SUSP: 0.5000 mL | Freq: Once | INTRAMUSCULAR | 0 refills | Status: AC

## 2019-08-04 NOTE — Progress Notes (Signed)
[facility] [address] [place], [place] [address]  Internal MEDICINE  Office Visit Note  Patient Name: Ebony Hutchinson  [id number]  [id number]  Date of Service: 08/05/2019  Chief Complaint  Patient presents with  . Hypertension  . Hyperlipidemia    The patient is here for routine follow up visit. She has history of hyperlipidemia. Her blood pressure is well managed on current medication. She is due to have check of routine, fasting labs. She needs to have mammogram. She has never had screening colonoscopy. Her mother did have colon cancer. Patient has been afraid to have colonoscopy. She has a friend who passed away from complications related to colonoscopy. The patient has had both Pfizer COVID 19 vaccines and these are recorded in her immunization record. She should have DTAP booster. She will have new grand baby in July and cannot remember the last time she had this vaccine.       Current Medication: Outpatient Encounter Medications as of 08/04/2019  Medication Sig  . atenolol (TENORMIN) 25 MG tablet Take 1 tablet (25 mg total) by mouth daily.  . hydrochlorothiazide (HYDRODIURIL) 12.5 MG tablet Take 1 tablet (12.5 mg total) by mouth daily.  . rosuvastatin (CRESTOR) 5 MG tablet Take 1 tablet (5 mg total) by mouth daily at 6 PM.  . tiZANidine (ZANAFLEX) 4 MG tablet Take 1 tablet (4 mg total) by mouth at bedtime as needed for muscle spasms.  . [EXPIRED] Tdap (BOOSTRIX) 5-2.5-18.5 LF-MCG/0.5 injection Inject 0.5 mLs into the muscle once for 1 dose.   No facility-administered encounter medications on file as of 08/04/2019.    Surgical History: Past Surgical History:  Procedure Laterality Date  . ABDOMINAL HYSTERECTOMY  2011    Medical History: Past Medical History:  Diagnosis Date  . Hypertension     Family History: Family History  Problem Relation Age of Onset  . Colon cancer Mother        mom had colon cancer and it was removed  . Heart Problems Father    . Heart attack Brother     Social History   Socioeconomic History  . Marital status: Divorced    Spouse name: Not on file  . Number of children: Not on file  . Years of education: Not on file  . Highest education level: Not on file  Occupational History  . Not on file  Tobacco Use  . Smoking status: Never Smoker  . Smokeless tobacco: Never Used  Substance and Sexual Activity  . Alcohol use: Yes    Comment: occasionally  . Drug use: No  . Sexual activity: Not on file  Other Topics Concern  . Not on file  Social History Narrative  . Not on file   Social Determinants of Health   Financial Resource Strain:   . Difficulty of Paying Living Expenses:   Food Insecurity:   . Worried About Programme researcher, broadcasting/film/video in the Last Year:   . Barista in the Last Year:   Transportation Needs:   . Freight forwarder (Medical):   Marland Kitchen Lack of Transportation (Non-Medical):   Physical Activity:   . Days of Exercise per Week:   . Minutes of Exercise per Session:   Stress:   . Feeling of Stress :   Social Connections:   . Frequency of Communication with Friends and Family:   . Frequency of Social Gatherings with Friends and Family:   . Attends Religious Services:   . Active Member  of Clubs or Organizations:   . Attends Archivist Meetings:   Marland Kitchen Marital Status:   Intimate Partner Violence:   . Fear of Current or Ex-Partner:   . Emotionally Abused:   Marland Kitchen Physically Abused:   . Sexually Abused:       Review of Systems  Constitutional: Negative for activity change, chills, fatigue and unexpected weight change.  HENT: Negative for congestion, postnasal drip, rhinorrhea, sneezing and sore throat.   Respiratory: Negative for cough, chest tightness, shortness of breath and wheezing.   Cardiovascular: Negative for chest pain and palpitations.       Blood pressure doing very well.   Gastrointestinal: Negative for abdominal pain, constipation, diarrhea, nausea and vomiting.   Endocrine: Negative for cold intolerance, heat intolerance, polydipsia and polyuria.  Musculoskeletal: Negative for arthralgias, back pain, joint swelling, myalgias and neck pain.  Skin: Negative for rash.  Allergic/Immunologic: Negative for environmental allergies.  Neurological: Positive for headaches. Negative for dizziness, tremors and numbness.  Hematological: Negative for adenopathy. Does not bruise/bleed easily.  Psychiatric/Behavioral: Negative for behavioral problems (Depression), sleep disturbance and suicidal ideas. The patient is not nervous/anxious.     Today's Vitals   08/04/19 0923  BP: 120/69  Pulse: 79  Resp: 16  Temp: (!) 97.3 F (36.3 C)  SpO2: 97%  Weight: 170 lb 3.2 oz (77.2 kg)  Height: 5\' 2"  (1.575 m)   Body mass index is 31.13 kg/m.  Physical Exam Vitals and nursing note reviewed.  Constitutional:      General: She is not in acute distress.    Appearance: Normal appearance. She is well-developed. She is not diaphoretic.  HENT:     Head: Normocephalic and atraumatic.     Nose: Nose normal.     Mouth/Throat:     Pharynx: No oropharyngeal exudate.  Eyes:     Pupils: Pupils are equal, round, and reactive to light.  Neck:     Thyroid: No thyromegaly.     Vascular: No JVD.     Trachea: No tracheal deviation.  Cardiovascular:     Rate and Rhythm: Normal rate and regular rhythm.     Heart sounds: Normal heart sounds. No murmur. No friction rub. No gallop.   Pulmonary:     Effort: Pulmonary effort is normal. No respiratory distress.     Breath sounds: Normal breath sounds. No wheezing or rales.  Chest:     Chest wall: No tenderness.  Abdominal:     Palpations: Abdomen is soft.  Musculoskeletal:        General: Normal range of motion.     Cervical back: Normal range of motion and neck supple.  Lymphadenopathy:     Cervical: No cervical adenopathy.  Skin:    General: Skin is warm and dry.  Neurological:     Mental Status: She is alert and  oriented to person, place, and time.     Cranial Nerves: No cranial nerve deficit.  Psychiatric:        Mood and Affect: Mood normal.        Behavior: Behavior normal.        Thought Content: Thought content normal.        Judgment: Judgment normal.   Assessment/Plan: 1. Essential hypertension Well managed. Continue bp medication as prescribed   2. Mixed hyperlipidemia Check fasting lipid panel. Adjust crestor dosing as indicated.   3. Screening for colon cancer Refer to Dr. Vicente Males to discuss colonoscopy/endoscopy.  - Ambulatory referral to Gastroenterology  4. Encounter for screening mammogram for malignant neoplasm of breast - MM DIGITAL SCREENING BILATERAL; Future  5. Need for prophylactic vaccination with combined diphtheria-tetanus-pertussis (DTaP) vaccine Order for Tdap booster sent to her pharmacy for administration. Advised her to wait at least 2 weeks after receiving last COVID 19 vaccine prior to getting this vaccine.  - Tdap (BOOSTRIX) 5-2.5-18.5 LF-MCG/0.5 injection; Inject 0.5 mLs into the muscle once for 1 dose.  Dispense: 0.5 mL; Refill: 0  General Counseling: Pema verbalizes understanding of the findings of todays visit and agrees with plan of treatment. I have discussed any further diagnostic evaluation that may be needed or ordered today. We also reviewed her medications today. she has been encouraged to call the office with any questions or concerns that should arise related to todays visit.  Hypertension Counseling:   The following hypertensive lifestyle modification were recommended and discussed:  1. Limiting alcohol intake to less than 1 oz/day of ethanol:(24 oz of beer or 8 oz of wine or 2 oz of 100-proof whiskey). 2. Take baby ASA 81 mg daily. 3. Importance of regular aerobic exercise and losing weight. 4. Reduce dietary saturated fat and cholesterol intake for overall cardiovascular health. 5. Maintaining adequate dietary potassium, calcium, and magnesium  intake. 6. Regular monitoring of the blood pressure. 7. Reduce sodium intake to less than 100 mmol/day (less than 2.3 gm of sodium or less than 6 gm of sodium choride)   This patient was seen by Vincent Gros FNP Collaboration with Dr Lyndon Code as a part of collaborative care agreement  Orders Placed This Encounter  Procedures  . MM DIGITAL SCREENING BILATERAL  . Ambulatory referral to Gastroenterology    Meds ordered this encounter  Medications  . Tdap (BOOSTRIX) 5-2.5-18.5 LF-MCG/0.5 injection    Sig: Inject 0.5 mLs into the muscle once for 1 dose.    Dispense:  0.5 mL    Refill:  0    Order Specific Question:   Supervising Provider    Answer:   Lyndon Code [[id number]]    Total time spent: 30 Minutes   Time spent includes review of chart, medications, test results, and follow up plan with the patient.      Dr Lyndon Code Internal medicine

## 2019-08-05 DIAGNOSIS — Z1211 Encounter for screening for malignant neoplasm of colon: Secondary | ICD-10-CM | POA: Insufficient documentation

## 2019-08-05 DIAGNOSIS — Z23 Encounter for immunization: Secondary | ICD-10-CM | POA: Insufficient documentation

## 2019-08-05 DIAGNOSIS — Z1231 Encounter for screening mammogram for malignant neoplasm of breast: Secondary | ICD-10-CM | POA: Insufficient documentation

## 2019-10-01 ENCOUNTER — Other Ambulatory Visit: Payer: Self-pay

## 2019-10-01 DIAGNOSIS — I10 Essential (primary) hypertension: Secondary | ICD-10-CM

## 2019-10-01 MED ORDER — ATENOLOL 25 MG PO TABS: 25.0000 mg | ORAL_TABLET | Freq: Every day | ORAL | 5 refills | Status: DC

## 2019-10-06 ENCOUNTER — Other Ambulatory Visit: Payer: Self-pay | Admitting: Adult Health

## 2019-10-06 DIAGNOSIS — I10 Essential (primary) hypertension: Secondary | ICD-10-CM

## 2020-02-03 ENCOUNTER — Ambulatory Visit (INDEPENDENT_AMBULATORY_CARE_PROVIDER_SITE_OTHER): Payer: 59 | Admitting: Nurse Practitioner

## 2020-02-03 ENCOUNTER — Other Ambulatory Visit: Payer: Self-pay

## 2020-02-03 VITALS — BP 160/84 | HR 66 | Temp 97.5°F | Resp 16 | Ht 62.0 in | Wt 165.8 lb

## 2020-02-03 DIAGNOSIS — Z0001 Encounter for general adult medical examination with abnormal findings: Secondary | ICD-10-CM | POA: Diagnosis not present

## 2020-02-03 DIAGNOSIS — R300 Dysuria: Secondary | ICD-10-CM

## 2020-02-03 DIAGNOSIS — E782 Mixed hyperlipidemia: Secondary | ICD-10-CM | POA: Diagnosis not present

## 2020-02-03 DIAGNOSIS — I10 Essential (primary) hypertension: Secondary | ICD-10-CM

## 2020-02-03 DIAGNOSIS — Z1231 Encounter for screening mammogram for malignant neoplasm of breast: Secondary | ICD-10-CM

## 2020-02-03 DIAGNOSIS — Z1211 Encounter for screening for malignant neoplasm of colon: Secondary | ICD-10-CM

## 2020-02-03 MED ORDER — HYDROCHLOROTHIAZIDE 25 MG PO TABS: 25.0000 mg | ORAL_TABLET | Freq: Every day | ORAL | 1 refills | Status: DC

## 2020-02-03 NOTE — Progress Notes (Signed)
[facility] [address] [place], [place] [address]  Internal MEDICINE  Office Visit Note  Patient Name: Ebony Hutchinson  [id number]  [id number]  Date of Service: 02/18/2020   Pt is here for routine health maintenance examination  Chief Complaint  Patient presents with  . Annual Exam  . Hypertension  . controlled substance form    reviewed with PT  . Quality Metric Gaps    colonoscopy,tetnaus,Hep C     The patient is here for health maintenance exam. Her blood pressure is some elevated this morning. She states that she had a disturbing phone call just prior to her visit. Feels like that may be contributing to her elevated pressure. She states that she, intermittently, has some acid reflux. This is made worse with spicy food. She does take antacid which controls this. She is due to have routine, fasting labs. She is due to have mammogram. She should also have screening colonoscopy. Her mother does have history of colon cancer.    Current Medication: Outpatient Encounter Medications as of 02/03/2020  Medication Sig  . atenolol (TENORMIN) 25 MG tablet Take 1 tablet (25 mg total) by mouth daily.  . hydrochlorothiazide (HYDRODIURIL) 25 MG tablet Take 1 tablet (25 mg total) by mouth daily.  . [DISCONTINUED] hydrochlorothiazide (HYDRODIURIL) 12.5 MG tablet TAKE 1 TABLET BY MOUTH EVERY DAY  . [DISCONTINUED] rosuvastatin (CRESTOR) 5 MG tablet Take 1 tablet (5 mg total) by mouth daily at 6 PM.  . [DISCONTINUED] tiZANidine (ZANAFLEX) 4 MG tablet Take 1 tablet (4 mg total) by mouth at bedtime as needed for muscle spasms.   No facility-administered encounter medications on file as of 02/03/2020.    Surgical History: Past Surgical History:  Procedure Laterality Date  . ABDOMINAL HYSTERECTOMY  2011    Medical History: Past Medical History:  Diagnosis Date  . Hypertension     Family History: Family History  Problem Relation Age of Onset  . Colon cancer Mother         mom had colon cancer and it was removed  . Heart Problems Father   . Heart attack Brother       Review of Systems  Constitutional: Negative for activity change, chills, fatigue and unexpected weight change.  HENT: Negative for congestion, postnasal drip, rhinorrhea, sneezing and sore throat.   Respiratory: Negative for cough, chest tightness, shortness of breath and wheezing.   Cardiovascular: Negative for chest pain and palpitations.       Blood pressure moderately elevated today.   Gastrointestinal: Negative for abdominal pain, constipation, diarrhea, nausea and vomiting.       Intermittent GERD symptoms.  Endocrine: Negative for cold intolerance, heat intolerance, polydipsia and polyuria.  Musculoskeletal: Negative for arthralgias, back pain, joint swelling, myalgias and neck pain.  Skin: Negative for rash.  Allergic/Immunologic: Negative for environmental allergies.  Neurological: Positive for headaches. Negative for dizziness, tremors and numbness.  Hematological: Negative for adenopathy. Does not bruise/bleed easily.  Psychiatric/Behavioral: Negative for behavioral problems (Depression), sleep disturbance and suicidal ideas. The patient is not nervous/anxious.      Today's Vitals   02/03/20 0856  BP: (!) 160/84  Pulse: 66  Resp: 16  Temp: (!) 97.5 F (36.4 C)  SpO2: 99%  Weight: 165 lb 12.8 oz (75.2 kg)  Height: 5\' 2"  (1.575 m)   Body mass index is 30.33 kg/m.  Physical Exam Vitals and nursing note reviewed.  Constitutional:      General: She is not in acute distress.  Appearance: Normal appearance. She is well-developed. She is not diaphoretic.  HENT:     Head: Normocephalic and atraumatic.     Nose: Nose normal.     Mouth/Throat:     Pharynx: No oropharyngeal exudate.  Eyes:     Pupils: Pupils are equal, round, and reactive to light.  Neck:     Thyroid: No thyromegaly.     Vascular: No carotid bruit or JVD.     Trachea: No tracheal deviation.   Cardiovascular:     Rate and Rhythm: Normal rate and regular rhythm.     Pulses: Normal pulses.     Heart sounds: Normal heart sounds. No murmur heard.  No friction rub. No gallop.   Pulmonary:     Effort: Pulmonary effort is normal. No respiratory distress.     Breath sounds: Normal breath sounds. No wheezing or rales.  Chest:     Chest wall: No tenderness.     Breasts:        Right: Normal. No swelling, bleeding, inverted nipple, mass, nipple discharge, skin change or tenderness.        Left: Normal. No swelling, bleeding, inverted nipple, mass, nipple discharge, skin change or tenderness.  Abdominal:     General: Bowel sounds are normal.     Palpations: Abdomen is soft.     Tenderness: There is no abdominal tenderness.  Musculoskeletal:        General: Normal range of motion.     Cervical back: Normal range of motion and neck supple.  Lymphadenopathy:     Cervical: No cervical adenopathy.     Upper Body:     Right upper body: No axillary adenopathy.     Left upper body: No axillary adenopathy.  Skin:    General: Skin is warm and dry.     Capillary Refill: Capillary refill takes less than 2 seconds.  Neurological:     General: No focal deficit present.     Mental Status: She is alert and oriented to person, place, and time.     Cranial Nerves: No cranial nerve deficit.  Psychiatric:        Mood and Affect: Mood normal.        Behavior: Behavior normal.        Thought Content: Thought content normal.        Judgment: Judgment normal.      LABS: Recent Results (from the past 2160 hour(s))  UA/M w/rflx Culture, Routine     Status: None   Collection Time: 02/03/20  9:00 AM   Specimen: Urine   Urine  Result Value Ref Range   Specific Gravity, UA 1.021 1.005 - 1.030   pH, UA 5.0 5.0 - 7.5   Color, UA Yellow Yellow   Appearance Ur Clear Clear   Leukocytes,UA Negative Negative   Protein,UA Negative Negative/Trace   Glucose, UA Negative Negative   Ketones, UA  Negative Negative   RBC, UA Negative Negative   Bilirubin, UA Negative Negative   Urobilinogen, Ur 0.2 0.2 - 1.0 mg/dL   Nitrite, UA Negative Negative   Microscopic Examination Comment     Comment: Microscopic follows if indicated.   Microscopic Examination See below:     Comment: Microscopic was indicated and was performed.   Urinalysis Reflex Comment     Comment: This specimen will not reflex to a Urine Culture.  Microscopic Examination     Status: None   Collection Time: 02/03/20  9:00 AM   Urine  Result Value Ref Range   WBC, UA 0-5 0 - 5 /hpf   RBC 0-2 0 - 2 /hpf   Epithelial Cells (non renal) 0-10 0 - 10 /hpf   Casts None seen None seen /lpf   Bacteria, UA None seen None seen/Few    Assessment/Plan: 1. Encounter for general adult medical examination with abnormal findings Annual health maintenance exam today. Order slip givent to check routine, fasting labs.   2. Essential hypertension increate HCTZ to 25mg  daily. Advised her to limit sodium and increase water intake in the diet. Encouraged her to monitor blood pressure at home.  - hydrochlorothiazide (HYDRODIURIL) 25 MG tablet; Take 1 tablet (25 mg total) by mouth daily.  Dispense: 30 tablet; Refill: 1  3. Mixed hyperlipidemia Check fasting lipid panel and adjust crestor dosing as indicated   4. Encounter for screening mammogram for malignant neoplasm of breast - MM DIGITAL SCREENING BILATERAL; Future  5. Screening for colon cancer Refer to GI for screening colonoscopy.  - Ambulatory referral to Gastroenterology  6. Dysuria - UA/M w/rflx Culture, Routine  General Counseling: Kit verbalizes understanding of the findings of todays visit and agrees with plan of treatment. I have discussed any further diagnostic evaluation that may be needed or ordered today. We also reviewed her medications today. she has been encouraged to call the office with any questions or concerns that should arise related to todays  visit.    Counseling:  Hypertension Counseling:   The following hypertensive lifestyle modification were recommended and discussed:  1. Limiting alcohol intake to less than 1 oz/day of ethanol:(24 oz of beer or 8 oz of wine or 2 oz of 100-proof whiskey). 2. Take baby ASA 81 mg daily. 3. Importance of regular aerobic exercise and losing weight. 4. Reduce dietary saturated fat and cholesterol intake for overall cardiovascular health. 5. Maintaining adequate dietary potassium, calcium, and magnesium intake. 6. Regular monitoring of the blood pressure. 7. Reduce sodium intake to less than 100 mmol/day (less than 2.3 gm of sodium or less than 6 gm of sodium choride)   This patient was seen by FNP Collaboration with Dr Vincent Gros as a part of collaborative care agreement  Orders Placed This Encounter  Procedures  . Microscopic Examination  . MM DIGITAL SCREENING BILATERAL  . UA/M w/rflx Culture, Routine  . Ambulatory referral to Gastroenterology    Meds ordered this encounter  Medications  . hydrochlorothiazide (HYDRODIURIL) 25 MG tablet    Sig: Take 1 tablet (25 mg total) by mouth daily.    Dispense:  30 tablet    Refill:  1    Please note increased dose    Order Specific Question:   Supervising Provider    Answer:   Lyndon Code [[id number]]    Total time spent: 45 Minutes  Time spent includes review of chart, medications, test results, and follow up plan with the patient.     Lyndon Code, MD  Internal Medicine

## 2020-02-04 LAB — MICROSCOPIC EXAMINATION
Bacteria, UA: NONE SEEN
Casts: NONE SEEN /lpf

## 2020-02-04 LAB — UA/M W/RFLX CULTURE, ROUTINE
Bilirubin, UA: NEGATIVE
Glucose, UA: NEGATIVE
Ketones, UA: NEGATIVE
Leukocytes,UA: NEGATIVE
Nitrite, UA: NEGATIVE
Protein,UA: NEGATIVE
RBC, UA: NEGATIVE
Specific Gravity, UA: 1.021 (ref 1.005–1.030)
Urobilinogen, Ur: 0.2 mg/dL (ref 0.2–1.0)
pH, UA: 5 (ref 5.0–7.5)

## 2020-02-11 ENCOUNTER — Encounter: Payer: Self-pay | Admitting: *Deleted

## 2020-02-13 ENCOUNTER — Other Ambulatory Visit: Payer: Self-pay | Admitting: Nurse Practitioner

## 2020-02-13 DIAGNOSIS — Z1231 Encounter for screening mammogram for malignant neoplasm of breast: Secondary | ICD-10-CM

## 2020-02-18 ENCOUNTER — Encounter: Payer: Self-pay | Admitting: Nurse Practitioner

## 2020-02-25 ENCOUNTER — Other Ambulatory Visit
Admission: RE | Admit: 2020-02-25 | Discharge: 2020-02-25 | Disposition: A | Discharge status: Home / Self Care | Payer: 59 | Attending: Nurse Practitioner | Admitting: Nurse Practitioner

## 2020-02-25 DIAGNOSIS — E559 Vitamin D deficiency, unspecified: Secondary | ICD-10-CM | POA: Diagnosis not present

## 2020-02-25 DIAGNOSIS — Z0000 Encounter for general adult medical examination without abnormal findings: Secondary | ICD-10-CM | POA: Insufficient documentation

## 2020-02-25 DIAGNOSIS — Z1159 Encounter for screening for other viral diseases: Secondary | ICD-10-CM | POA: Insufficient documentation

## 2020-02-25 DIAGNOSIS — E785 Hyperlipidemia, unspecified: Secondary | ICD-10-CM | POA: Diagnosis not present

## 2020-02-25 DIAGNOSIS — I10 Essential (primary) hypertension: Secondary | ICD-10-CM | POA: Insufficient documentation

## 2020-02-25 LAB — CBC
HCT: 40.7 % (ref 36.0–46.0)
Hemoglobin: 14 g/dL (ref 12.0–15.0)
MCH: 30.3 pg (ref 26.0–34.0)
MCHC: 34.4 g/dL (ref 30.0–36.0)
MCV: 88.1 fL (ref 80.0–100.0)
Platelets: 264 10*3/uL (ref 150–400)
RBC: 4.62 MIL/uL (ref 3.87–5.11)
RDW: 12.6 % (ref 11.5–15.5)
WBC: 4.4 10*3/uL (ref 4.0–10.5)
nRBC: 0 % (ref 0.0–0.2)

## 2020-02-25 LAB — TSH: TSH: 2.959 u[IU]/mL (ref 0.350–4.500)

## 2020-02-25 LAB — VITAMIN D 25 HYDROXY (VIT D DEFICIENCY, FRACTURES): Vit D, 25-Hydroxy: 10.68 ng/mL — ABNORMAL LOW (ref 30–100)

## 2020-02-25 LAB — COMPREHENSIVE METABOLIC PANEL
ALT: 23 U/L (ref 0–44)
AST: 23 U/L (ref 15–41)
Albumin: 4.2 g/dL (ref 3.5–5.0)
Alkaline Phosphatase: 78 U/L (ref 38–126)
Anion gap: 13 (ref 5–15)
BUN: 16 mg/dL (ref 8–23)
CO2: 24 mmol/L (ref 22–32)
Calcium: 9 mg/dL (ref 8.9–10.3)
Chloride: 105 mmol/L (ref 98–111)
Creatinine, Ser: 0.79 mg/dL (ref 0.44–1.00)
GFR, Estimated: >60 mL/min (ref >60)
Glucose, Bld: 123 mg/dL — ABNORMAL HIGH (ref 70–99)
Potassium: 4 mmol/L (ref 3.5–5.1)
Sodium: 142 mmol/L (ref 135–145)
Total Bilirubin: 0.6 mg/dL (ref 0.3–1.2)
Total Protein: 7.3 g/dL (ref 6.5–8.1)

## 2020-02-25 LAB — LIPID PANEL
Cholesterol: 279 mg/dL — ABNORMAL HIGH (ref 0–200)
HDL: 55 mg/dL (ref >40)
LDL Cholesterol: 168 mg/dL — ABNORMAL HIGH (ref 0–99)
Total CHOL/HDL Ratio: 5.1 RATIO
Triglycerides: 280 mg/dL — ABNORMAL HIGH (ref <150)
VLDL: 56 mg/dL — ABNORMAL HIGH (ref 0–40)

## 2020-02-25 LAB — T4, FREE: Free T4: 0.96 ng/dL (ref 0.61–1.12)

## 2020-02-25 LAB — HEPATITIS C ANTIBODY: HCV Ab: NONREACTIVE

## 2020-03-02 ENCOUNTER — Encounter: Payer: Self-pay | Admitting: Nurse Practitioner

## 2020-03-02 ENCOUNTER — Other Ambulatory Visit: Payer: Self-pay

## 2020-03-02 ENCOUNTER — Ambulatory Visit: Payer: 59 | Admitting: Nurse Practitioner

## 2020-03-02 VITALS — BP 152/80 | HR 70 | Temp 97.5°F | Resp 16 | Ht 62.0 in | Wt 166.0 lb

## 2020-03-02 DIAGNOSIS — E559 Vitamin D deficiency, unspecified: Secondary | ICD-10-CM

## 2020-03-02 DIAGNOSIS — Z8249 Family history of ischemic heart disease and other diseases of the circulatory system: Secondary | ICD-10-CM | POA: Diagnosis not present

## 2020-03-02 DIAGNOSIS — R002 Palpitations: Secondary | ICD-10-CM | POA: Insufficient documentation

## 2020-03-02 DIAGNOSIS — R079 Chest pain, unspecified: Secondary | ICD-10-CM | POA: Diagnosis not present

## 2020-03-02 DIAGNOSIS — E782 Mixed hyperlipidemia: Secondary | ICD-10-CM

## 2020-03-02 DIAGNOSIS — I10 Essential (primary) hypertension: Secondary | ICD-10-CM

## 2020-03-02 MED ORDER — ROSUVASTATIN CALCIUM 10 MG PO TABS: 10.0000 mg | ORAL_TABLET | Freq: Every day | ORAL | 3 refills | Status: DC

## 2020-03-02 MED ORDER — ATENOLOL 25 MG PO TABS: 25.0000 mg | ORAL_TABLET | Freq: Two times a day (BID) | ORAL | 5 refills | Status: DC

## 2020-03-02 MED ORDER — ERGOCALCIFEROL 1.25 MG (50000 UT) PO CAPS: 50000.0000 [IU] | ORAL_CAPSULE | ORAL | 5 refills | Status: DC

## 2020-03-02 NOTE — Progress Notes (Signed)
[facility] [address] [place], [place] [address]  Internal MEDICINE  Office Visit Note  Patient Name: Ebony Hutchinson  [id number]  [id number]  Date of Service: 03/02/2020  Chief Complaint  Patient presents with  . Follow-up  . Hypertension  . controlled substance form    reviewed with PT    The patient is here for follow up visit. Today, she is complaining of intermittent palpitations. Was started on atenolol 25mg  every evening. Blood pressure is improved, but still elevated. The patient has strong family history of cardiovascular disease. Her father and five of her brothers passed away from heart related issues. Reviewed patient labs. Vitamin d was very low. Cholesterol panel was moderately elevated with increased risk of developing cardiovascular disease in the future.       Current Medication: Outpatient Encounter Medications as of 03/02/2020  Medication Sig  . atenolol (TENORMIN) 25 MG tablet Take 1 tablet (25 mg total) by mouth 2 (two) times daily.  . hydrochlorothiazide (HYDRODIURIL) 25 MG tablet Take 1 tablet (25 mg total) by mouth daily.  . [DISCONTINUED] atenolol (TENORMIN) 25 MG tablet Take 1 tablet (25 mg total) by mouth daily.  . ergocalciferol (DRISDOL) 1.25 MG (50000 UT) capsule Take 1 capsule (50,000 Units total) by mouth once a week.  . rosuvastatin (CRESTOR) 10 MG tablet Take 1 tablet (10 mg total) by mouth daily.   No facility-administered encounter medications on file as of 03/02/2020.    Surgical History: Past Surgical History:  Procedure Laterality Date  . ABDOMINAL HYSTERECTOMY  2011    Medical History: Past Medical History:  Diagnosis Date  . Hypertension     Family History: Family History  Problem Relation Age of Onset  . Colon cancer Mother        mom had colon cancer and it was removed  . Heart Problems Father   . Heart attack Brother     Social History   Socioeconomic History  . Marital status: Divorced    Spouse  name: Not on file  . Number of children: Not on file  . Years of education: Not on file  . Highest education level: Not on file  Occupational History  . Not on file  Tobacco Use  . Smoking status: Never Smoker  . Smokeless tobacco: Never Used  Substance and Sexual Activity  . Alcohol use: Yes    Comment: occasionally  . Drug use: No  . Sexual activity: Not on file  Other Topics Concern  . Not on file  Social History Narrative  . Not on file   Social Determinants of Health   Financial Resource Strain:   . Difficulty of Paying Living Expenses: Not on file  Food Insecurity:   . Worried About 2012 in the Last Year: Not on file  . Ran Out of Food in the Last Year: Not on file  Transportation Needs:   . Lack of Transportation (Medical): Not on file  . Lack of Transportation (Non-Medical): Not on file  Physical Activity:   . Days of Exercise per Week: Not on file  . Minutes of Exercise per Session: Not on file  Stress:   . Feeling of Stress : Not on file  Social Connections:   . Frequency of Communication with Friends and Family: Not on file  . Frequency of Social Gatherings with Friends and Family: Not on file  . Attends Religious Services: Not on file  . Active Member of Clubs or Organizations: Not  on file  . Attends Banker Meetings: Not on file  . Marital Status: Not on file  Intimate Partner Violence:   . Fear of Current or Ex-Partner: Not on file  . Emotionally Abused: Not on file  . Physically Abused: Not on file  . Sexually Abused: Not on file      Review of Systems  Constitutional: Negative for activity change, chills, fatigue and unexpected weight change.  HENT: Negative for congestion, postnasal drip, rhinorrhea, sneezing and sore throat.   Respiratory: Negative for cough, chest tightness, shortness of breath and wheezing.   Cardiovascular: Positive for palpitations. Negative for chest pain.       Resistent blood pressure. Strong  family history of cardiovascular disease.   Gastrointestinal: Negative for abdominal pain, constipation, diarrhea, nausea and vomiting.  Endocrine: Negative for cold intolerance, heat intolerance, polydipsia and polyuria.  Musculoskeletal: Negative for arthralgias, back pain, joint swelling and neck pain.  Skin: Negative for rash.  Allergic/Immunologic: Negative for environmental allergies.  Neurological: Negative for dizziness, tremors, numbness and headaches.  Hematological: Negative for adenopathy. Does not bruise/bleed easily.  Psychiatric/Behavioral: Negative for behavioral problems (Depression), dysphoric mood, sleep disturbance and suicidal ideas. The patient is not nervous/anxious.     Today's Vitals   03/02/20 0955  BP: (!) 152/80  Pulse: 70  Resp: 16  Temp: (!) 97.5 F (36.4 C)  SpO2: 99%  Weight: 166 lb (75.3 kg)  Height: 5\' 2"  (1.575 m)   Body mass index is 30.36 kg/m.  Physical Exam Vitals and nursing note reviewed.  Constitutional:      General: She is not in acute distress.    Appearance: Normal appearance. She is well-developed. She is not diaphoretic.  HENT:     Head: Normocephalic and atraumatic.     Nose: Nose normal.     Mouth/Throat:     Pharynx: No oropharyngeal exudate.  Eyes:     Pupils: Pupils are equal, round, and reactive to light.  Neck:     Thyroid: No thyromegaly.     Vascular: No JVD.     Trachea: No tracheal deviation.  Cardiovascular:     Rate and Rhythm: Normal rate and regular rhythm.     Heart sounds: Normal heart sounds. No murmur heard.  No friction rub. No gallop.      Comments: ECG in the office is within normal limits.  Pulmonary:     Effort: Pulmonary effort is normal. No respiratory distress.     Breath sounds: Normal breath sounds. No wheezing or rales.  Chest:     Chest wall: No tenderness.  Abdominal:     General: Bowel sounds are normal.     Palpations: Abdomen is soft.     Tenderness: There is no abdominal  tenderness.  Musculoskeletal:        General: Normal range of motion.     Cervical back: Normal range of motion and neck supple.  Lymphadenopathy:     Cervical: No cervical adenopathy.  Skin:    General: Skin is warm and dry.  Neurological:     General: No focal deficit present.     Mental Status: She is alert and oriented to person, place, and time.     Cranial Nerves: No cranial nerve deficit.  Psychiatric:        Mood and Affect: Mood normal.        Behavior: Behavior normal.        Thought Content: Thought content normal.  Judgment: Judgment normal.    Assessment/Plan: 1. Chest pain, unspecified type ECG today is within normal limits. Will get echo and exercise stress test for further evaluation.  - EKG 12-Lead - ECHOCARDIOGRAM COMPLETE; Future - EXERCISE TOLERANCE TEST (ETT); Future  2. Palpitations ECG today is within normal limits. Will get echo and exercise stress test for further evaluation.  - ECHOCARDIOGRAM COMPLETE; Future - EXERCISE TOLERANCE TEST (ETT); Future  3. Essential hypertension Increase atenolol 25mg  tablets to twice daily. - atenolol (TENORMIN) 25 MG tablet; Take 1 tablet (25 mg total) by mouth 2 (two) times daily.  Dispense: 60 tablet; Refill: 5  4. Family history of cardiovascular disease Will get baseline echocardiogram and exercise stress test for surveillance.   5. Mixed hyperlipidemia reviiewed labs showing moderately elevated cholesterol panel with increased cardiovascular risk. Start crestor 10mg  daily with dinner. Prudent diet information was provided for her. Will recheck lipid panel in three months.  - rosuvastatin (CRESTOR) 10 MG tablet; Take 1 tablet (10 mg total) by mouth daily.  Dispense: 30 tablet; Refill: 3  6. Vitamin D deficiency Start drisdol weekly.  - ergocalciferol (DRISDOL) 1.25 MG (50000 UT) capsule; Take 1 capsule (50,000 Units total) by mouth once a week.  Dispense: 4 capsule; Refill: 5  General Counseling: Latesha  verbalizes understanding of the findings of todays visit and agrees with plan of treatment. I have discussed any further diagnostic evaluation that may be needed or ordered today. We also reviewed her medications today. she has been encouraged to call the office with any questions or concerns that should arise related to todays visit.  Cardiac risk factor modification:  1. Control blood pressure. 2. Exercise as prescribed. 3. Follow low sodium, low fat diet. and low fat and low cholestrol diet. 4. Take ASA 81mg  once a day. 5. Restricted calories diet to lose weight.  This patient was seen by FNP Collaboration with Dr as a part of collaborative care agreement  Orders Placed This Encounter  Procedures  . EXERCISE TOLERANCE TEST (ETT)  . EKG 12-Lead  . ECHOCARDIOGRAM COMPLETE    Meds ordered this encounter  Medications  . ergocalciferol (DRISDOL) 1.25 MG (50000 UT) capsule    Sig: Take 1 capsule (50,000 Units total) by mouth once a week.    Dispense:  4 capsule    Refill:  5    Order Specific Question:   Supervising Provider    Answer:   [[id number]]  . rosuvastatin (CRESTOR) 10 MG tablet    Sig: Take 1 tablet (10 mg total) by mouth daily.    Dispense:  30 tablet    Refill:  3    Order Specific Question:   Supervising Provider    Answer:   Vincent Gros [[id number]]  . atenolol (TENORMIN) 25 MG tablet    Sig: Take 1 tablet (25 mg total) by mouth 2 (two) times daily.    Dispense:  60 tablet    Refill:  5    Please note increased dose to twice daily    Order Specific Question:   Supervising Provider    Answer:   Lyndon Code [[id number]]    Total time spent: 45 Minutes   Time spent includes review of chart, medications, test results, and follow up plan with the patient.      Dr Lyndon Code Internal medicine

## 2020-03-09 ENCOUNTER — Other Ambulatory Visit: Payer: Self-pay

## 2020-03-09 DIAGNOSIS — I10 Essential (primary) hypertension: Secondary | ICD-10-CM

## 2020-03-09 MED ORDER — HYDROCHLOROTHIAZIDE 25 MG PO TABS: 25.0000 mg | ORAL_TABLET | Freq: Every day | ORAL | 1 refills | Status: DC

## 2020-03-15 ENCOUNTER — Other Ambulatory Visit
Admission: RE | Admit: 2020-03-15 | Discharge: 2020-03-15 | Disposition: A | Discharge status: Home / Self Care | Payer: 59 | Source: Ambulatory Visit | Attending: Internal Medicine | Admitting: Internal Medicine

## 2020-03-15 ENCOUNTER — Other Ambulatory Visit: Payer: Self-pay

## 2020-03-15 DIAGNOSIS — Z01812 Encounter for preprocedural laboratory examination: Secondary | ICD-10-CM | POA: Diagnosis not present

## 2020-03-15 DIAGNOSIS — Z20822 Contact with and (suspected) exposure to covid-19: Secondary | ICD-10-CM | POA: Insufficient documentation

## 2020-03-15 LAB — SARS CORONAVIRUS 2 (TAT 6-24 HRS): SARS Coronavirus 2: NEGATIVE

## 2020-03-17 ENCOUNTER — Other Ambulatory Visit: Payer: Self-pay

## 2020-03-17 ENCOUNTER — Ambulatory Visit
Admission: RE | Admit: 2020-03-17 | Discharge: 2020-03-17 | Disposition: A | Discharge status: Home / Self Care | Payer: 59 | Source: Ambulatory Visit | Attending: Nurse Practitioner | Admitting: Nurse Practitioner

## 2020-03-17 DIAGNOSIS — R079 Chest pain, unspecified: Secondary | ICD-10-CM

## 2020-03-17 DIAGNOSIS — R002 Palpitations: Secondary | ICD-10-CM

## 2020-03-17 LAB — EXERCISE TOLERANCE TEST
Exercise duration (min): 9 min
MPHR: 157 {beats}/min
Peak HR: 109 {beats}/min
Percent HR: 69 %
Rest HR: 68 {beats}/min

## 2020-03-23 ENCOUNTER — Other Ambulatory Visit: Payer: Self-pay

## 2020-03-23 ENCOUNTER — Ambulatory Visit
Admission: RE | Admit: 2020-03-23 | Discharge: 2020-03-23 | Disposition: A | Discharge status: Home / Self Care | Payer: 59 | Source: Ambulatory Visit | Attending: Nurse Practitioner | Admitting: Nurse Practitioner

## 2020-03-23 DIAGNOSIS — Z1231 Encounter for screening mammogram for malignant neoplasm of breast: Secondary | ICD-10-CM | POA: Diagnosis present

## 2020-03-24 NOTE — Progress Notes (Signed)
Negative mammogram

## 2020-03-31 ENCOUNTER — Other Ambulatory Visit: Payer: Self-pay

## 2020-03-31 DIAGNOSIS — I10 Essential (primary) hypertension: Secondary | ICD-10-CM

## 2020-03-31 MED ORDER — HYDROCHLOROTHIAZIDE 25 MG PO TABS: 25.0000 mg | ORAL_TABLET | Freq: Every day | ORAL | 1 refills | Status: DC

## 2020-04-02 ENCOUNTER — Other Ambulatory Visit: Payer: 59

## 2020-04-13 ENCOUNTER — Ambulatory Visit: Payer: 59 | Admitting: Nurse Practitioner

## 2020-04-13 ENCOUNTER — Other Ambulatory Visit: Payer: Self-pay

## 2020-04-19 ENCOUNTER — Other Ambulatory Visit: Payer: Self-pay | Admitting: Nurse Practitioner

## 2020-04-19 DIAGNOSIS — I10 Essential (primary) hypertension: Secondary | ICD-10-CM

## 2020-04-30 ENCOUNTER — Other Ambulatory Visit: Payer: 59

## 2020-05-07 ENCOUNTER — Ambulatory Visit: Payer: 59 | Admitting: Hospice and Palliative Medicine

## 2020-11-03 ENCOUNTER — Other Ambulatory Visit: Payer: Self-pay | Admitting: Nurse Practitioner

## 2020-11-03 DIAGNOSIS — I10 Essential (primary) hypertension: Secondary | ICD-10-CM

## 2020-11-09 ENCOUNTER — Encounter: Payer: Self-pay | Admitting: Physician Assistant

## 2020-11-09 ENCOUNTER — Other Ambulatory Visit: Payer: Self-pay

## 2020-11-09 ENCOUNTER — Ambulatory Visit: Payer: 59 | Admitting: Physician Assistant

## 2020-11-09 DIAGNOSIS — I10 Essential (primary) hypertension: Secondary | ICD-10-CM | POA: Diagnosis not present

## 2020-11-09 DIAGNOSIS — Z8249 Family history of ischemic heart disease and other diseases of the circulatory system: Secondary | ICD-10-CM | POA: Diagnosis not present

## 2020-11-09 DIAGNOSIS — E782 Mixed hyperlipidemia: Secondary | ICD-10-CM | POA: Diagnosis not present

## 2020-11-09 DIAGNOSIS — R0989 Other specified symptoms and signs involving the circulatory and respiratory systems: Secondary | ICD-10-CM | POA: Diagnosis not present

## 2020-11-09 MED ORDER — ATENOLOL 25 MG PO TABS: 25.0000 mg | ORAL_TABLET | Freq: Two times a day (BID) | ORAL | 5 refills | Status: DC

## 2020-11-09 MED ORDER — HYDROCHLOROTHIAZIDE 25 MG PO TABS: 25.0000 mg | ORAL_TABLET | Freq: Every day | ORAL | 1 refills | Status: DC

## 2020-11-09 NOTE — Progress Notes (Signed)
[facility] [address] [place], [place] [address]  Internal MEDICINE  Office Visit Note  Patient Name: Ebony Hutchinson  [id number]  [id number]  Date of Service: 11/09/2020  Chief Complaint  Patient presents with   Follow-up    med refill, bp med, still has some tenderness in right arm    Hypertension    HPI Pt is here for routine follow up for med refill -Bp at home 140/80s, did not take hctz this AM because ran out off medication. Will refill meds today and recheck BP in a few weeks. If still elevated then will consider additional medication. Takes atenolol BID and id take her AM dose today. HR stable. -Had stress test in Nov due to Fhx of CV disease, but did not have echo done.  -Patient did report she stopped her crestor because she does not like medications and thought she could work on diet. Encouraged to restart due to HLD and Fhx and pt agreeable to this and states she still has it at home. -Does report she thinks she pulled a muscle in Right arm a few weeks ago that limited ROM, however has been improving and has full ROM again. Continues to do exercises to keep it moving.  Current Medication: Outpatient Encounter Medications as of 11/09/2020  Medication Sig   [DISCONTINUED] atenolol (TENORMIN) 25 MG tablet Take 1 tablet (25 mg total) by mouth 2 (two) times daily.   [DISCONTINUED] hydrochlorothiazide (HYDRODIURIL) 25 MG tablet TAKE 1 TABLET BY MOUTH EVERY DAY   atenolol (TENORMIN) 25 MG tablet Take 1 tablet (25 mg total) by mouth 2 (two) times daily.   hydrochlorothiazide (HYDRODIURIL) 25 MG tablet Take 1 tablet (25 mg total) by mouth daily.   [DISCONTINUED] ergocalciferol (DRISDOL) 1.25 MG (50000 UT) capsule Take 1 capsule (50,000 Units total) by mouth once a week. (Patient not taking: Reported on 11/09/2020)   [DISCONTINUED] rosuvastatin (CRESTOR) 10 MG tablet Take 1 tablet (10 mg total) by mouth daily.   No facility-administered encounter medications on file  as of 11/09/2020.    Surgical History: Past Surgical History:  Procedure Laterality Date   ABDOMINAL HYSTERECTOMY  2011    Medical History: Past Medical History:  Diagnosis Date   Hypertension     Family History: Family History  Problem Relation Age of Onset   Colon cancer Mother        mom had colon cancer and it was removed   Heart Problems Father    Heart attack Brother    Breast cancer Sister     Social History   Socioeconomic History   Marital status: Divorced    Spouse name: Not on file   Number of children: Not on file   Years of education: Not on file   Highest education level: Not on file  Occupational History   Not on file  Tobacco Use   Smoking status: Never   Smokeless tobacco: Never  Substance and Sexual Activity   Alcohol use: Yes    Comment: occasionally   Drug use: No   Sexual activity: Not on file  Other Topics Concern   Not on file  Social History Narrative   Not on file   Social Determinants of Health   Financial Resource Strain: Not on file  Food Insecurity: Not on file  Transportation Needs: Not on file  Physical Activity: Not on file  Stress: Not on file  Social Connections: Not on file  Intimate Partner Violence: Not on file  Review of Systems  Constitutional:  Negative for chills, fatigue and unexpected weight change.  HENT:  Negative for congestion, postnasal drip, rhinorrhea, sneezing and sore throat.   Eyes:  Negative for redness.  Respiratory:  Negative for cough, chest tightness and shortness of breath.   Cardiovascular:  Negative for chest pain and palpitations.  Gastrointestinal:  Negative for abdominal pain, constipation, diarrhea, nausea and vomiting.  Genitourinary:  Negative for dysuria and frequency.  Musculoskeletal:  Positive for myalgias. Negative for back pain, joint swelling and neck pain.       R arm pain and ROM improving  Skin:  Negative for rash.  Neurological: Negative.  Negative for tremors and  numbness.  Hematological:  Negative for adenopathy. Does not bruise/bleed easily.  Psychiatric/Behavioral:  Negative for behavioral problems (Depression), sleep disturbance and suicidal ideas. The patient is not nervous/anxious.    Vital Signs: BP (!) 145/80   Pulse 69   Temp (!) 97 F (36.1 C)   Resp 16   Ht 5\' 2"  (1.575 m)   Wt 164 lb 6.4 oz (74.6 kg)   SpO2 98%   BMI 30.07 kg/m    Physical Exam Vitals and nursing note reviewed.  Constitutional:      General: She is not in acute distress.    Appearance: She is well-developed. She is obese. She is not diaphoretic.  HENT:     Head: Normocephalic and atraumatic.     Mouth/Throat:     Pharynx: No oropharyngeal exudate.  Eyes:     Pupils: Pupils are equal, round, and reactive to light.  Neck:     Thyroid: No thyromegaly.     Vascular: No JVD.     Trachea: No tracheal deviation.  Cardiovascular:     Rate and Rhythm: Normal rate and regular rhythm.     Heart sounds: Normal heart sounds. No murmur heard.   No friction rub. No gallop.  Pulmonary:     Effort: Pulmonary effort is normal. No respiratory distress.     Breath sounds: No wheezing or rales.  Chest:     Chest wall: No tenderness.  Abdominal:     General: Bowel sounds are normal.     Palpations: Abdomen is soft.  Musculoskeletal:        General: Normal range of motion.     Cervical back: Normal range of motion and neck supple.     Right lower leg: No edema.     Left lower leg: No edema.  Lymphadenopathy:     Cervical: No cervical adenopathy.  Skin:    General: Skin is warm and dry.  Neurological:     Mental Status: She is alert and oriented to person, place, and time.     Cranial Nerves: No cranial nerve deficit.  Psychiatric:        Behavior: Behavior normal.        Thought Content: Thought content normal.        Judgment: Judgment normal.       Assessment/Plan: 1. Essential hypertension Elevated in office, but ran out of HCTZ. Will send refills -  atenolol (TENORMIN) 25 MG tablet; Take 1 tablet (25 mg total) by mouth 2 (two) times daily.  Dispense: 60 tablet; Refill: 5 - hydrochlorothiazide (HYDRODIURIL) 25 MG tablet; Take 1 tablet (25 mg total) by mouth daily.  Dispense: 90 tablet; Refill: 1  2. Bilateral carotid bruits - Carotid Duplex Bilateral; Future  3. Mixed hyperlipidemia Will restart crestor  4. Family history of  cardiovascular disease Had stress testing in Nov, and educated to restart crestor for HLD and CV protection. Will obtain carotid US, and consider echo next visit   General Counseling: Blondina verbalizes understanding of the findings of todays visit and agrees with plan of treatment. I have discussed any further diagnostic evaluation that may be needed or ordered today. We also reviewed her medications today. she has been encouraged to call the office with any questions or concerns that should arise related to todays visit.    Orders Placed This Encounter  Procedures   US Carotid Duplex Bilateral    Meds ordered this encounter  Medications   atenolol (TENORMIN) 25 MG tablet    Sig: Take 1 tablet (25 mg total) by mouth 2 (two) times daily.    Dispense:  60 tablet    Refill:  5    Please note increased dose to twice daily   hydrochlorothiazide (HYDRODIURIL) 25 MG tablet    Sig: Take 1 tablet (25 mg total) by mouth daily.    Dispense:  90 tablet    Refill:  1    This patient was seen by Lynn Ito, PA-C in collaboration with Dr. Beverely Risen as a part of collaborative care agreement.   Total time spent:35 Minutes Time spent includes review of chart, medications, test results, and follow up plan with the patient.      Dr Lyndon Code Internal medicine

## 2021-01-25 ENCOUNTER — Ambulatory Visit (INDEPENDENT_AMBULATORY_CARE_PROVIDER_SITE_OTHER): Payer: 59 | Admitting: Nurse Practitioner

## 2021-01-25 ENCOUNTER — Other Ambulatory Visit: Payer: Self-pay

## 2021-01-25 ENCOUNTER — Ambulatory Visit
Admission: RE | Admit: 2021-01-25 | Discharge: 2021-01-25 | Disposition: A | Discharge status: Home / Self Care | Payer: 59 | Attending: Nurse Practitioner | Admitting: Nurse Practitioner

## 2021-01-25 ENCOUNTER — Encounter: Payer: Self-pay | Admitting: Nurse Practitioner

## 2021-01-25 ENCOUNTER — Ambulatory Visit
Admission: RE | Admit: 2021-01-25 | Discharge: 2021-01-25 | Disposition: A | Discharge status: Home / Self Care | Payer: 59 | Source: Ambulatory Visit | Attending: Nurse Practitioner | Admitting: Nurse Practitioner

## 2021-01-25 VITALS — BP 136/72 | HR 71 | Temp 98.3°F | Resp 16 | Ht 62.0 in | Wt 157.0 lb

## 2021-01-25 DIAGNOSIS — E538 Deficiency of other specified B group vitamins: Secondary | ICD-10-CM

## 2021-01-25 DIAGNOSIS — F458 Other somatoform disorders: Secondary | ICD-10-CM

## 2021-01-25 DIAGNOSIS — Z0001 Encounter for general adult medical examination with abnormal findings: Secondary | ICD-10-CM

## 2021-01-25 DIAGNOSIS — R7303 Prediabetes: Secondary | ICD-10-CM

## 2021-01-25 DIAGNOSIS — E559 Vitamin D deficiency, unspecified: Secondary | ICD-10-CM

## 2021-01-25 DIAGNOSIS — E782 Mixed hyperlipidemia: Secondary | ICD-10-CM

## 2021-01-25 DIAGNOSIS — D649 Anemia, unspecified: Secondary | ICD-10-CM

## 2021-01-25 DIAGNOSIS — Z23 Encounter for immunization: Secondary | ICD-10-CM

## 2021-01-25 DIAGNOSIS — I10 Essential (primary) hypertension: Secondary | ICD-10-CM

## 2021-01-25 DIAGNOSIS — Z1231 Encounter for screening mammogram for malignant neoplasm of breast: Secondary | ICD-10-CM

## 2021-01-25 DIAGNOSIS — Z1211 Encounter for screening for malignant neoplasm of colon: Secondary | ICD-10-CM

## 2021-01-25 MED ORDER — POLYETHYLENE GLYCOL 3350 17 GM/SCOOP PO POWD
17.0000 g | Freq: Two times a day (BID) | ORAL | 2 refills | Status: DC | PRN
Start: 2021-01-25 — End: 2022-06-13

## 2021-01-25 MED ORDER — TETANUS-DIPHTH-ACELL PERTUSSIS 5-2-15.5 LF-MCG/0.5 IM SUSP: 0.5000 mL | Freq: Once | INTRAMUSCULAR | 0 refills | Status: AC

## 2021-01-25 NOTE — Progress Notes (Signed)
[facility] [place], [place] [address]  Internal MEDICINE  Office Visit Note  Patient Name: Ebony Hutchinson  [id number]  [id number]  Date of Service: 01/25/2021  Chief Complaint  Patient presents with   Annual Exam    Belly pain, started last weekend, over that last year pt holding bowl movements but now she is experiencing constipation and thinks she might have a blockage, laxatives have helped    Hypertension    HPI Ebony Hutchinson presents for an annual well visit and physical exam. she has a history of hypertension and surgical history of a hysterectomy. She has had 2 doses of the covid vaccine. She is due for a screening mammogram. She had a routine colonoscopy in 2021. She reports having natural immunity to small pox and chicken pox since she was a child and declined the shingles vaccine. She denies any pain. She reports have a problem with constipation more recently. She usually has 3 Bms per day now she is finding that she I holding her Bms so she can keep spending time with grandchildren because she feels like if she leaves to use the restroom she might miss something. She is now going once a day and they are small hard balls of stool. She feels like she may be developing a blockage.      Current Medication: Outpatient Encounter Medications as of 01/25/2021  Medication Sig   atenolol (TENORMIN) 25 MG tablet Take 1 tablet (25 mg total) by mouth 2 (two) times daily.   hydrochlorothiazide (HYDRODIURIL) 25 MG tablet Take 1 tablet (25 mg total) by mouth daily.   polyethylene glycol powder (GLYCOLAX/MIRALAX) 17 GM/SCOOP powder Take 17 g by mouth 2 (two) times daily as needed for moderate constipation or severe constipation.   [EXPIRED] Tdap (ADACEL) 08-23-13.5 LF-MCG/0.5 injection Inject 0.5 mLs into the muscle once for 1 dose.   No facility-administered encounter medications on file as of 01/25/2021.    Surgical History: Past Surgical History:  Procedure Laterality  Date   ABDOMINAL HYSTERECTOMY  2011    Medical History: Past Medical History:  Diagnosis Date   Hypertension     Family History: Family History  Problem Relation Age of Onset   Colon cancer Mother        mom had colon cancer and it was removed   Heart Problems Father    Heart attack Brother    Breast cancer Sister     Social History   Socioeconomic History   Marital status: Divorced    Spouse name: Not on file   Number of children: Not on file   Years of education: Not on file   Highest education level: Not on file  Occupational History   Not on file  Tobacco Use   Smoking status: Never   Smokeless tobacco: Never  Substance and Sexual Activity   Alcohol use: Yes    Comment: occasionally   Drug use: No   Sexual activity: Not on file  Other Topics Concern   Not on file  Social History Narrative   Not on file   Social Determinants of Health   Financial Resource Strain: Not on file  Food Insecurity: Not on file  Transportation Needs: Not on file  Physical Activity: Not on file  Stress: Not on file  Social Connections: Not on file  Intimate Partner Violence: Not on file      Review of Systems  Constitutional:  Negative for activity change, appetite change, chills, fatigue, fever and  unexpected weight change.  HENT: Negative.  Negative for congestion, ear pain, rhinorrhea, sore throat and trouble swallowing.   Eyes: Negative.   Respiratory: Negative.  Negative for cough, chest tightness, shortness of breath and wheezing.   Cardiovascular: Negative.  Negative for chest pain.  Gastrointestinal:  Positive for constipation. Negative for abdominal pain, blood in stool, diarrhea, nausea and vomiting.  Endocrine: Negative.   Genitourinary: Negative.  Negative for difficulty urinating, dysuria, frequency, hematuria and urgency.  Musculoskeletal: Negative.  Negative for arthralgias, back pain, joint swelling, myalgias and neck pain.  Skin: Negative.  Negative for  rash and wound.  Allergic/Immunologic: Negative.  Negative for immunocompromised state.  Neurological: Negative.  Negative for dizziness, seizures, numbness and headaches.  Hematological: Negative.   Psychiatric/Behavioral: Negative.  Negative for behavioral problems, self-injury and suicidal ideas. The patient is not nervous/anxious.    Vital Signs: BP 136/72   Pulse 71   Temp 98.3 F (36.8 C)   Resp 16   Ht _0  (1.575 m)   Wt 157 lb (71.2 kg)   SpO2 98%   BMI 28.72 kg/m    Physical Exam Vitals reviewed.  Constitutional:      General: She is awake. She is not in acute distress.    Appearance: Normal appearance. She is well-developed, well-groomed and normal weight. She is not ill-appearing or diaphoretic.  HENT:     Head: Normocephalic and atraumatic.     Right Ear: Tympanic membrane, ear canal and external ear normal.     Left Ear: Tympanic membrane, ear canal and external ear normal.     Nose: Nose normal. No congestion or rhinorrhea.     Mouth/Throat:     Lips: Pink.     Mouth: Mucous membranes are moist.     Pharynx: Oropharynx is clear. Uvula midline. No oropharyngeal exudate or posterior oropharyngeal erythema.  Eyes:     General: Lids are normal. Vision grossly intact. Gaze aligned appropriately. No scleral icterus.       Right eye: No discharge.        Left eye: No discharge.     Extraocular Movements: Extraocular movements intact.     Conjunctiva/sclera: Conjunctivae normal.     Pupils: Pupils are equal, round, and reactive to light.     Funduscopic exam:    Right eye: Red reflex present.        Left eye: Red reflex present. Neck:     Thyroid: No thyromegaly.     Vascular: No carotid bruit or JVD.     Trachea: Trachea and phonation normal. No tracheal deviation.  Cardiovascular:     Rate and Rhythm: Normal rate and regular rhythm.     Pulses: Normal pulses.     Heart sounds: Normal heart sounds, S1 normal and S2 normal. No murmur heard.   No friction  rub. No gallop.  Pulmonary:     Effort: Pulmonary effort is normal. No accessory muscle usage or respiratory distress.     Breath sounds: Normal breath sounds and air entry. No stridor. No wheezing or rales.  Chest:     Chest wall: No tenderness.     Comments: Declined clinical breast exam, mammogram ordered. Abdominal:     General: Bowel sounds are normal. There is no distension.     Palpations: Abdomen is soft. There is no mass.     Tenderness: There is no abdominal tenderness. There is no guarding or rebound.  Musculoskeletal:        General: No  tenderness or deformity. Normal range of motion.     Cervical back: Normal range of motion and neck supple.  Lymphadenopathy:     Cervical: No cervical adenopathy.  Skin:    General: Skin is warm and dry.     Coloration: Skin is not pale.     Findings: No erythema or rash.  Neurological:     Mental Status: She is alert and oriented to person, place, and time.     Cranial Nerves: No cranial nerve deficit.     Motor: No abnormal muscle tone.     Coordination: Coordination normal.     Gait: Gait normal.     Deep Tendon Reflexes: Reflexes are normal and symmetric.  Psychiatric:        Mood and Affect: Mood and affect normal.        Behavior: Behavior normal. Behavior is cooperative.        Thought Content: Thought content normal.        Judgment: Judgment normal.       Assessment/Plan: 1. Encounter for general adult medical examination with abnormal findings Age-appropriate preventive screenings and vaccinations discussed, annual physical exam completed. Routine labs for health maintenance ordered, see below. PHM updated.   2. Psychogenic constipation Xray ordered to assess possible cause of constipation.  - DG Abd 2 Views; Future  3. Essential hypertension Blood pressure is stable with current medications.   4. Prediabetes Last A1C 2 years ago was 6.3, repeat lab ordered.  - Hemoglobin A1c - CMP14+EGFR  5. Vitamin D  deficiency History of severely low vitamin D level, routine lab ordered.  - Vitamin D (25 hydroxy)  6. Mixed hyperlipidemia Routine lab ordered.  - Lipid Profile  7. Anemia, unspecified type Rule out anemia - CBC with Differential/Platelet  8. B12 deficiency Rule out vitamin deficiencies - B12 and Folate Panel  9. Encounter for screening mammogram for malignant neoplasm of breast Routine mammogram ordered - MM DIGITAL SCREENING BILATERAL; Future  10. Need for vaccination - Tdap (ADACEL) 08-23-13.5 LF-MCG/0.5 injection; Inject 0.5 mLs into the muscle once for 1 dose.  Dispense: 0.5 mL; Refill: 0     General Counseling: Beautiful verbalizes understanding of the findings of todays visit and agrees with plan of treatment. I have discussed any further diagnostic evaluation that may be needed or ordered today. We also reviewed her medications today. she has been encouraged to call the office with any questions or concerns that should arise related to todays visit.    Orders Placed This Encounter  Procedures   DG Abd 2 Views   MM DIGITAL SCREENING BILATERAL    Meds ordered this encounter  Medications   Tdap (ADACEL) 08-23-13.5 LF-MCG/0.5 injection    Sig: Inject 0.5 mLs into the muscle once for 1 dose.    Dispense:  0.5 mL    Refill:  0   polyethylene glycol powder (GLYCOLAX/MIRALAX) 17 GM/SCOOP powder    Sig: Take 17 g by mouth 2 (two) times daily as needed for moderate constipation or severe constipation.    Dispense:  850 g    Refill:  2    Return in about 6 months (around 07/26/2021) for F/U, med refill, Edmundo Tedesco PCP.   Total time spent:30 Minutes Time spent includes review of chart, medications, test results, and follow up plan with the patient.   [place] Controlled Substance Database was reviewed by me.  This patient was seen by Jonetta Osgood, FNP-C in collaboration with Dr. Clayborn Bigness as a part  of collaborative care agreement.  Corneilus Heggie R. Valetta Fuller, MSN, FNP-C Internal  medicine

## 2021-02-15 ENCOUNTER — Telehealth: Payer: Self-pay

## 2021-02-15 NOTE — Telephone Encounter (Signed)
-----   Message from Sallyanne Kuster, NP sent at 02/13/2021  3:35 PM EDT ----- Regarding: labs and mammogram Hi Alex,  Please call patient and let her know that I ordered labs since her last set of labs was done in November 2021. I did not realize during her last office visit that I had not ordered the labs so they are in now. Please ask her to go to any [facility] and the labs need to be fasting.  Also I had previously ordered a mammogram, please ask her if she has called to scheduled if, if she has not please remind her to.   Thanks, Alyssa

## 2021-02-15 NOTE — Telephone Encounter (Signed)
Spoke to pt, pt said she will schedule mammogram in the next few days and also informed her to get her fasting blood work

## 2021-02-16 ENCOUNTER — Other Ambulatory Visit: Payer: Self-pay | Admitting: Physician Assistant

## 2021-02-16 DIAGNOSIS — I10 Essential (primary) hypertension: Secondary | ICD-10-CM

## 2021-03-03 ENCOUNTER — Other Ambulatory Visit: Payer: Self-pay | Admitting: Nurse Practitioner

## 2021-03-03 DIAGNOSIS — Z1231 Encounter for screening mammogram for malignant neoplasm of breast: Secondary | ICD-10-CM

## 2021-03-24 ENCOUNTER — Other Ambulatory Visit: Payer: Self-pay | Admitting: Physician Assistant

## 2021-03-24 DIAGNOSIS — I10 Essential (primary) hypertension: Secondary | ICD-10-CM

## 2021-03-25 ENCOUNTER — Ambulatory Visit
Admission: RE | Admit: 2021-03-25 | Discharge: 2021-03-25 | Disposition: A | Discharge status: Home / Self Care | Payer: 59 | Source: Ambulatory Visit | Attending: Nurse Practitioner | Admitting: Nurse Practitioner

## 2021-03-25 ENCOUNTER — Other Ambulatory Visit: Payer: Self-pay

## 2021-03-25 ENCOUNTER — Other Ambulatory Visit
Admission: RE | Admit: 2021-03-25 | Discharge: 2021-03-25 | Disposition: A | Discharge status: Home / Self Care | Payer: 59 | Source: Ambulatory Visit | Attending: Nurse Practitioner | Admitting: Nurse Practitioner

## 2021-03-25 DIAGNOSIS — Z1231 Encounter for screening mammogram for malignant neoplasm of breast: Secondary | ICD-10-CM | POA: Diagnosis not present

## 2021-03-25 LAB — COMPREHENSIVE METABOLIC PANEL
ALT: 23 U/L (ref 0–44)
AST: 22 U/L (ref 15–41)
Albumin: 4.2 g/dL (ref 3.5–5.0)
Alkaline Phosphatase: 76 U/L (ref 38–126)
Anion gap: 6 (ref 5–15)
BUN: 17 mg/dL (ref 8–23)
CO2: 26 mmol/L (ref 22–32)
Calcium: 9.1 mg/dL (ref 8.9–10.3)
Chloride: 107 mmol/L (ref 98–111)
Creatinine, Ser: 0.72 mg/dL (ref 0.44–1.00)
GFR, Estimated: >60 mL/min (ref >60)
Glucose, Bld: 112 mg/dL — ABNORMAL HIGH (ref 70–99)
Potassium: 4.1 mmol/L (ref 3.5–5.1)
Sodium: 139 mmol/L (ref 135–145)
Total Bilirubin: 0.5 mg/dL (ref 0.3–1.2)
Total Protein: 7.3 g/dL (ref 6.5–8.1)

## 2021-03-25 LAB — LIPID PANEL
Cholesterol: 314 mg/dL — ABNORMAL HIGH (ref 0–200)
HDL: 63 mg/dL (ref >40)
LDL Cholesterol: 219 mg/dL — ABNORMAL HIGH (ref 0–99)
Total CHOL/HDL Ratio: 5 RATIO
Triglycerides: 158 mg/dL — ABNORMAL HIGH (ref <150)
VLDL: 32 mg/dL (ref 0–40)

## 2021-03-25 LAB — CBC WITH DIFFERENTIAL/PLATELET
Abs Immature Granulocytes: 0.01 10*3/uL (ref 0.00–0.07)
Basophils Absolute: 0.1 10*3/uL (ref 0.0–0.1)
Basophils Relative: 1 %
Eosinophils Absolute: 0.2 10*3/uL (ref 0.0–0.5)
Eosinophils Relative: 3 %
HCT: 40.6 % (ref 36.0–46.0)
Hemoglobin: 13.8 g/dL (ref 12.0–15.0)
Immature Granulocytes: 0 %
Lymphocytes Relative: 44 %
Lymphs Abs: 2.3 10*3/uL (ref 0.7–4.0)
MCH: 30.9 pg (ref 26.0–34.0)
MCHC: 34 g/dL (ref 30.0–36.0)
MCV: 90.8 fL (ref 80.0–100.0)
Monocytes Absolute: 0.5 10*3/uL (ref 0.1–1.0)
Monocytes Relative: 10 %
Neutro Abs: 2.2 10*3/uL (ref 1.7–7.7)
Neutrophils Relative %: 42 %
Platelets: 242 10*3/uL (ref 150–400)
RBC: 4.47 MIL/uL (ref 3.87–5.11)
RDW: 12.3 % (ref 11.5–15.5)
WBC: 5.3 10*3/uL (ref 4.0–10.5)
nRBC: 0 % (ref 0.0–0.2)

## 2021-03-25 LAB — VITAMIN B12: Vitamin B-12: 270 pg/mL (ref 180–914)

## 2021-03-25 LAB — VITAMIN D 25 HYDROXY (VIT D DEFICIENCY, FRACTURES): Vit D, 25-Hydroxy: 9.14 ng/mL — ABNORMAL LOW (ref 30–100)

## 2021-03-25 LAB — FOLATE: Folate: 6.9 ng/mL (ref >5.9)

## 2021-03-26 LAB — HEMOGLOBIN A1C
Hgb A1c MFr Bld: 6.4 % — ABNORMAL HIGH (ref 4.8–5.6)
Mean Plasma Glucose: 137 mg/dL

## 2021-04-25 IMAGING — MG DIGITAL SCREENING BILAT W/ TOMO W/ CAD
8 series · 8 of 24 positions shown · non-contrast
Comparison: Previous exam(s).

CLINICAL DATA: Screening.

EXAM:
DIGITAL SCREENING BILATERAL MAMMOGRAM WITH TOMO AND CAD

[R MLO synth-2D]
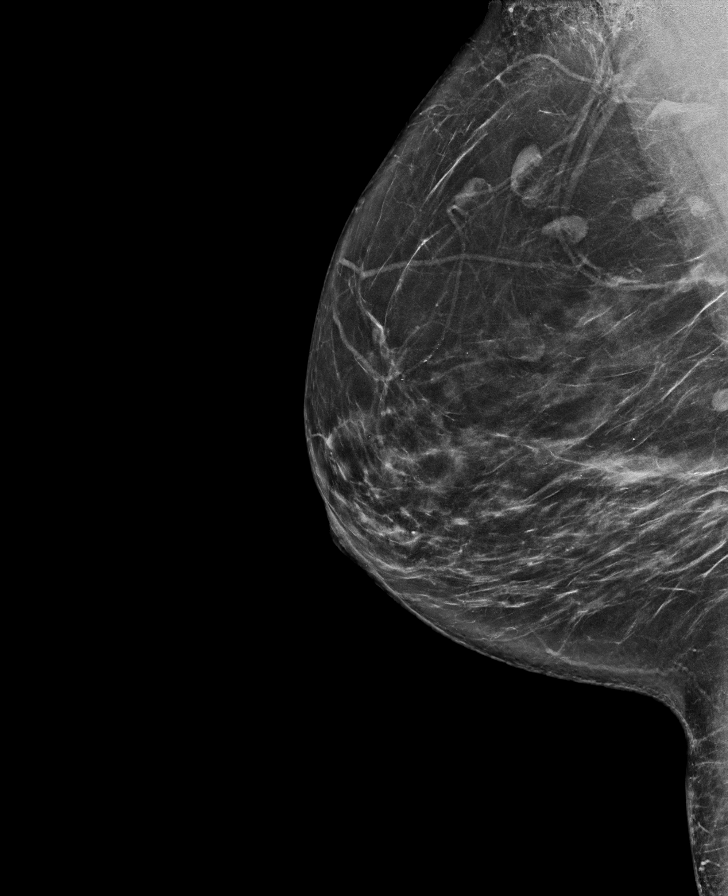

[R CC synth-2D]
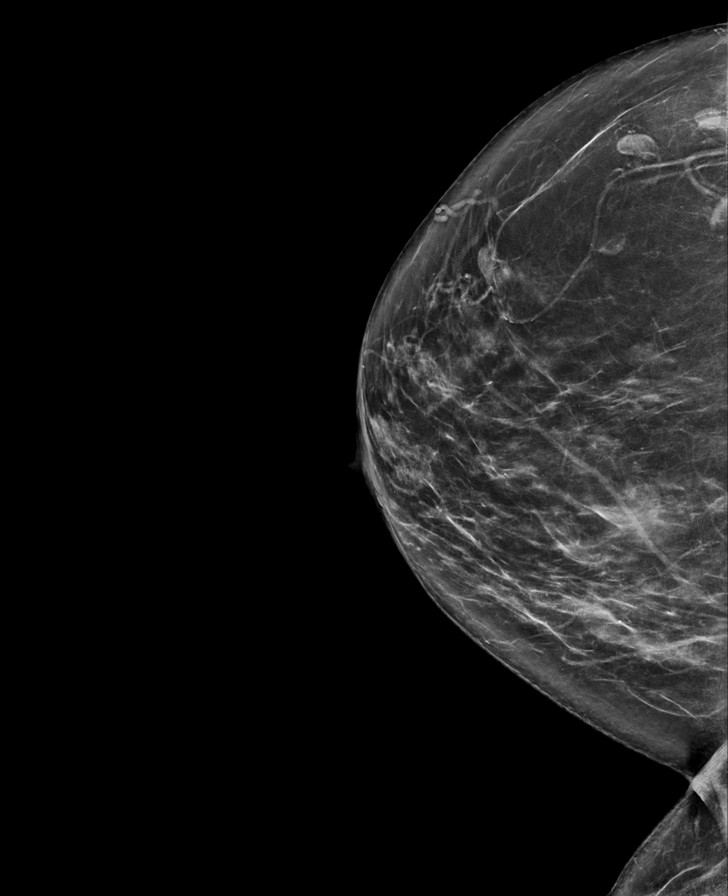

[L CC synth-2D]
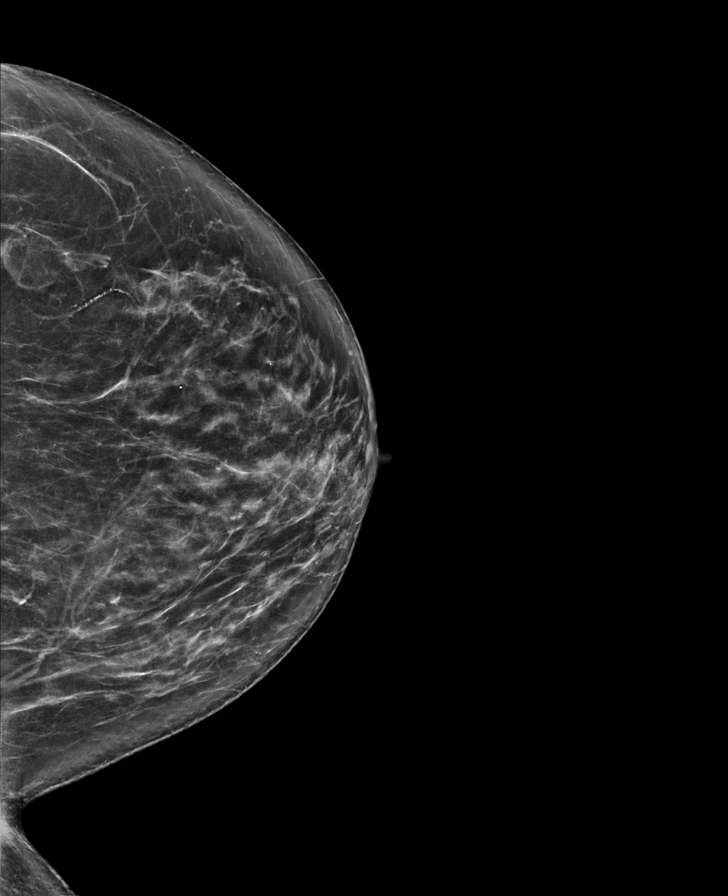

[L MLO synth-2D]
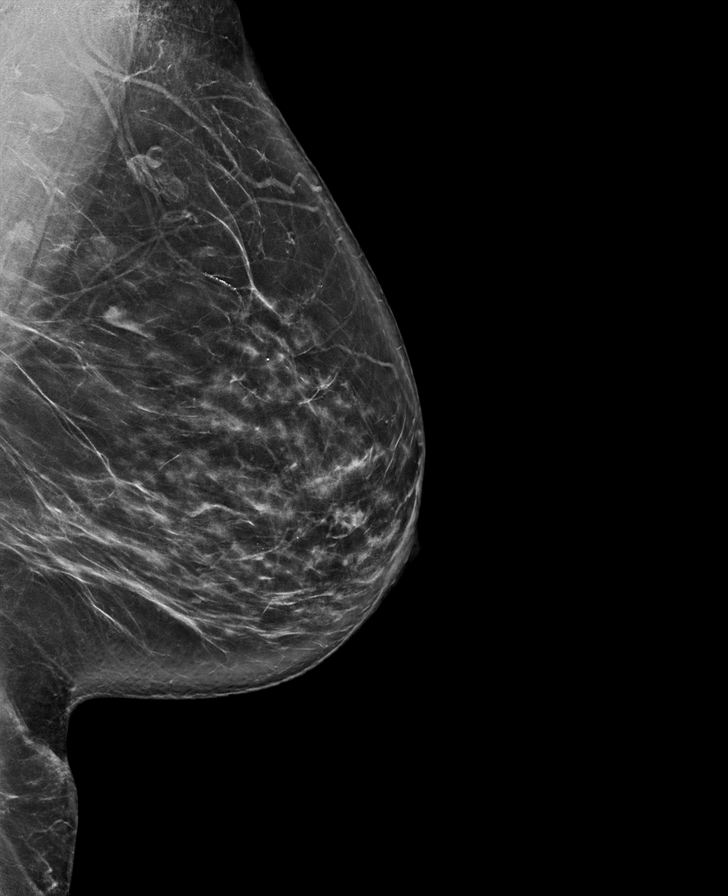

[R MLO tomo · tomo slice 39/78.0]
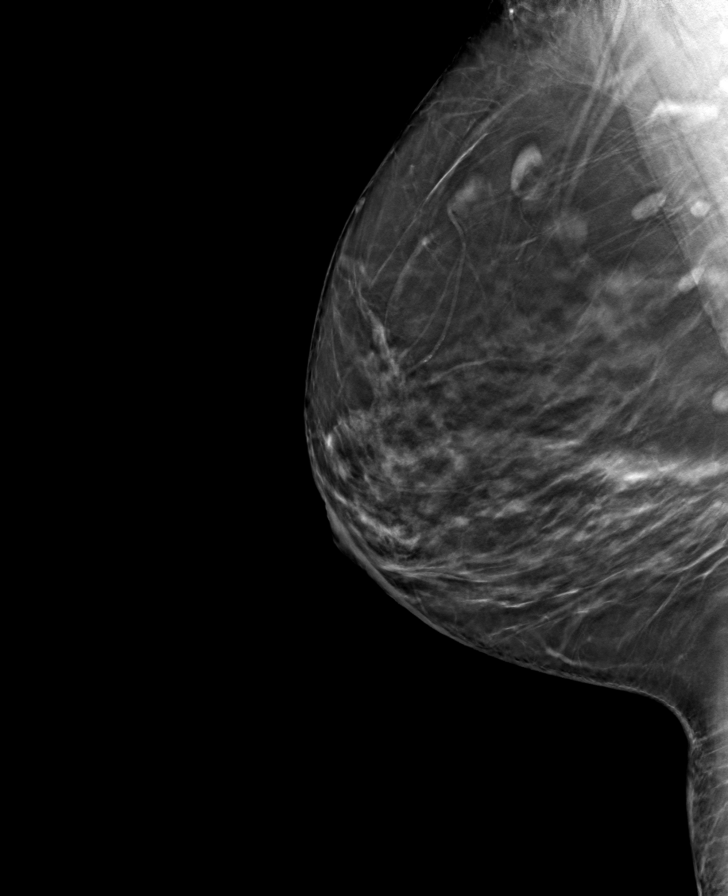

[R CC tomo · tomo slice 38/75.0]
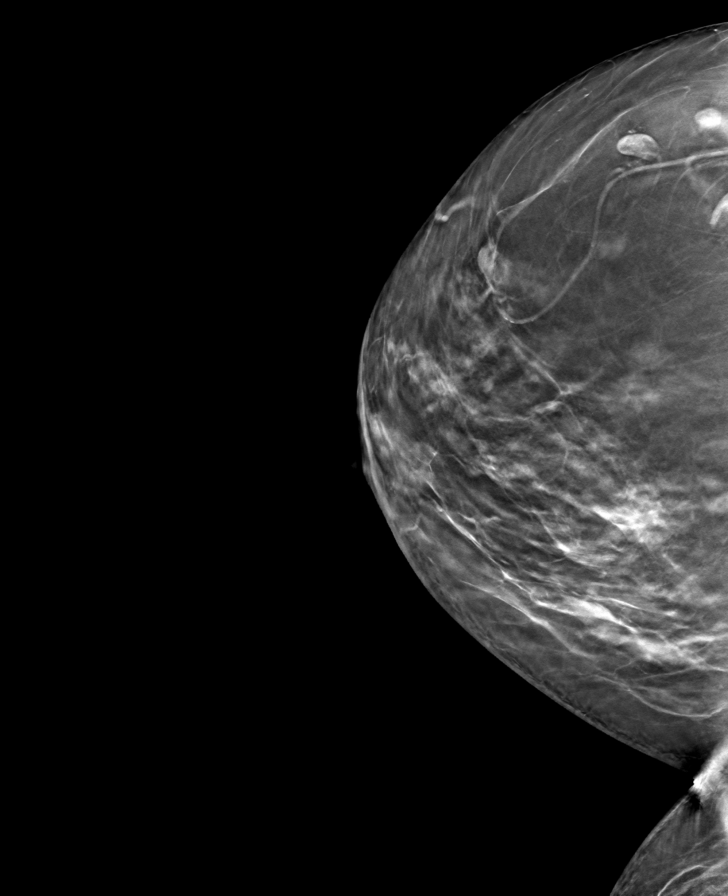

[L MLO tomo · tomo slice 41/82.0]
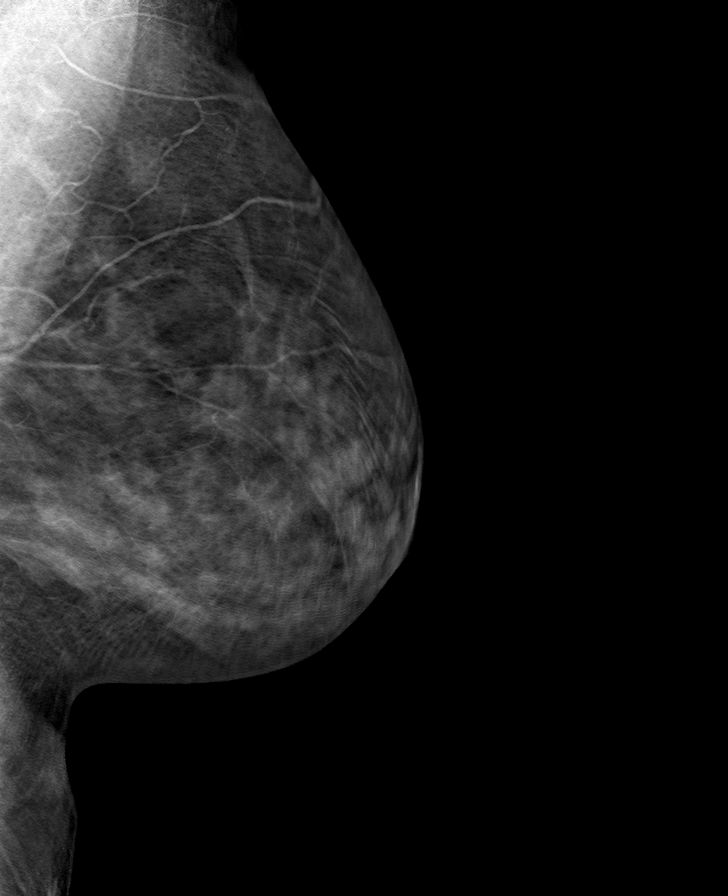

[L CC tomo · tomo slice 37/72.0]
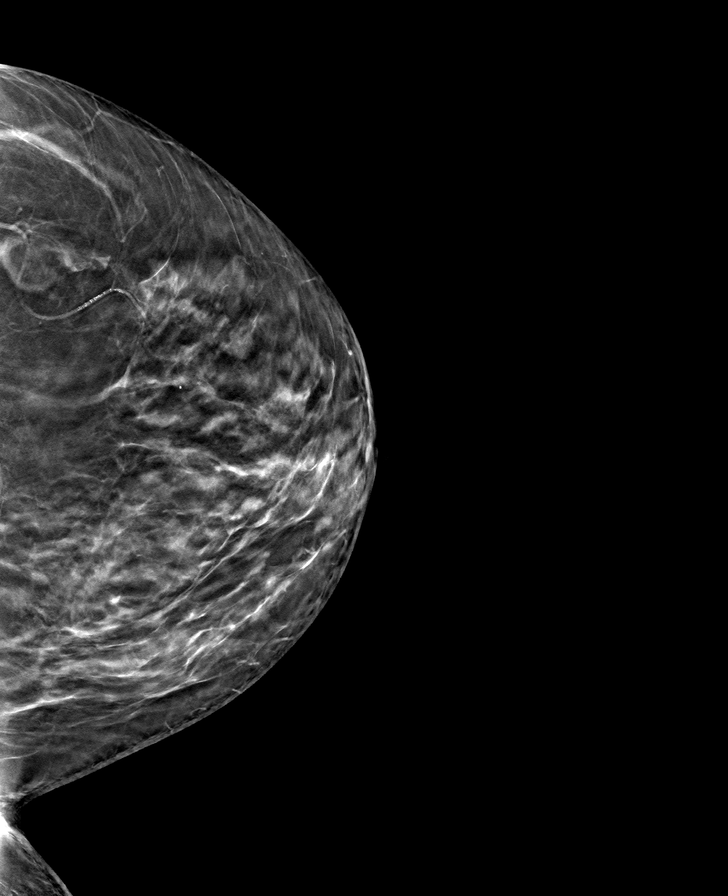

[8 of 24 positions shown; findings below may reference images not displayed]

ACR Breast Density Category c: The breast tissue is heterogeneously
dense, which may obscure small masses.
FINDINGS: There are no findings suspicious for malignancy. Images were
processed with CAD.
IMPRESSION: No mammographic evidence of malignancy. A result letter of this
screening mammogram will be mailed directly to the patient.

RECOMMENDATION:
Screening mammogram in one year. (Code:FT-U-LHB)

BI-RADS CATEGORY  1: Negative.

## 2021-05-17 ENCOUNTER — Telehealth: Payer: Self-pay

## 2021-05-17 NOTE — Telephone Encounter (Signed)
Lvm to schedule pcp follow up with dfk-Toni

## 2021-05-24 NOTE — Progress Notes (Signed)
Labs reviewed, patient is scheduled for office visit in 3 days, will discuss lab results with patient then

## 2021-05-26 ENCOUNTER — Other Ambulatory Visit: Payer: Self-pay | Admitting: Physician Assistant

## 2021-05-26 DIAGNOSIS — I10 Essential (primary) hypertension: Secondary | ICD-10-CM

## 2021-05-27 ENCOUNTER — Encounter: Payer: Self-pay | Admitting: Nurse Practitioner

## 2021-05-27 ENCOUNTER — Other Ambulatory Visit: Payer: Self-pay

## 2021-05-27 ENCOUNTER — Ambulatory Visit: Payer: 59 | Admitting: Nurse Practitioner

## 2021-05-27 VITALS — BP 136/80 | HR 69 | Temp 98.2°F | Resp 16 | Ht 62.0 in | Wt 161.0 lb

## 2021-05-27 DIAGNOSIS — E559 Vitamin D deficiency, unspecified: Secondary | ICD-10-CM | POA: Diagnosis not present

## 2021-05-27 DIAGNOSIS — I10 Essential (primary) hypertension: Secondary | ICD-10-CM

## 2021-05-27 DIAGNOSIS — E782 Mixed hyperlipidemia: Secondary | ICD-10-CM | POA: Diagnosis not present

## 2021-05-27 DIAGNOSIS — E1165 Type 2 diabetes mellitus with hyperglycemia: Secondary | ICD-10-CM

## 2021-05-27 DIAGNOSIS — E538 Deficiency of other specified B group vitamins: Secondary | ICD-10-CM

## 2021-05-27 MED ORDER — ATENOLOL 25 MG PO TABS: 25.0000 mg | ORAL_TABLET | Freq: Two times a day (BID) | ORAL | 1 refills | Status: DC

## 2021-05-27 MED ORDER — HYDROCHLOROTHIAZIDE 25 MG PO TABS: 25.0000 mg | ORAL_TABLET | Freq: Every day | ORAL | 1 refills | Status: DC

## 2021-05-27 MED ORDER — VITAMIN D (ERGOCALCIFEROL) 1.25 MG (50000 UNIT) PO CAPS: 50000.0000 [IU] | ORAL_CAPSULE | ORAL | 1 refills | Status: DC

## 2021-05-27 MED ORDER — CYANOCOBALAMIN 1000 MCG/ML IJ SOLN
1000.0000 ug | Freq: Once | INTRAMUSCULAR | Status: AC
  Administered 2021-05-27: 1000 ug via INTRAMUSCULAR

## 2021-05-27 NOTE — Progress Notes (Signed)
[facility] [place], [place] [address]  Internal MEDICINE  Office Visit Note  Patient Name: Ebony Hutchinson  [id number]  [id number]  Date of Service: 05/27/2021  Chief Complaint  Patient presents with   Follow-up   Hypertension   Medication Refill    HPI Najmah presents for a follow-up visit for hypertension, medication refills and to review her lab results.   Labs reviewed with patient during visit today: -- Her A1c was 6.4 which is slightly increased from 6.3 a couple years ago. --Her B12 level was 270 which is borderline low --Folate level of 6.9 which is normal. -- Her vitamin D level is significantly low at 9.14. -- Her lipid panel is significantly abnormal.  Her cholesterol level is significantly elevated at 314 and her LDL was also significantly elevated at 219.  Her triglyceride level has improved but is slightly elevated at 158. -- Her metabolic panel is normal except for slightly elevated glucose of 112 --Her CBC is normal.  Her blood pressure is stable with current medications. She does not have a glucose meter and does not know how to check her glucose level.    Current Medication: Outpatient Encounter Medications as of 05/27/2021  Medication Sig   Accu-Chek Softclix Lancets lancets Use 1 lancet to check glucose level at least once daily. Diag E11.65   glucose blood (ACCU-CHEK GUIDE) test strip Use 1 test strip to check blood glucose at least once daily. Diag E11.65   polyethylene glycol powder (GLYCOLAX/MIRALAX) 17 GM/SCOOP powder Take 17 g by mouth 2 (two) times daily as needed for moderate constipation or severe constipation.   rosuvastatin (CRESTOR) 10 MG tablet Take 1 tablet (10 mg total) by mouth daily.   Vitamin D, Ergocalciferol, (DRISDOL) 1.25 MG (50000 UNIT) CAPS capsule Take 1 capsule (50,000 Units total) by mouth every 7 (seven) days.   [DISCONTINUED] atenolol (TENORMIN) 25 MG tablet TAKE 1 TABLET(25 MG) BY MOUTH TWICE DAILY    [DISCONTINUED] hydrochlorothiazide (HYDRODIURIL) 25 MG tablet TAKE 1 TABLET(25 MG) BY MOUTH DAILY   atenolol (TENORMIN) 25 MG tablet Take 1 tablet (25 mg total) by mouth 2 (two) times daily.   hydrochlorothiazide (HYDRODIURIL) 25 MG tablet Take 1 tablet (25 mg total) by mouth daily.   [EXPIRED] cyanocobalamin ((VITAMIN B-12)) injection 1,000 mcg    No facility-administered encounter medications on file as of 05/27/2021.    Surgical History: Past Surgical History:  Procedure Laterality Date   ABDOMINAL HYSTERECTOMY  2011    Medical History: Past Medical History:  Diagnosis Date   Hypertension     Family History: Family History  Problem Relation Age of Onset   Colon cancer Mother        mom had colon cancer and it was removed   Heart Problems Father    Heart attack Brother    Breast cancer Sister     Social History   Socioeconomic History   Marital status: Divorced    Spouse name: Not on file   Number of children: Not on file   Years of education: Not on file   Highest education level: Not on file  Occupational History   Not on file  Tobacco Use   Smoking status: Never   Smokeless tobacco: Never  Substance and Sexual Activity   Alcohol use: Yes    Comment: occasionally   Drug use: No   Sexual activity: Not on file  Other Topics Concern   Not on file  Social History Narrative  Not on file   Social Determinants of Health   Financial Resource Strain: Not on file  Food Insecurity: Not on file  Transportation Needs: Not on file  Physical Activity: Not on file  Stress: Not on file  Social Connections: Not on file  Intimate Partner Violence: Not on file      Review of Systems  Constitutional:  Negative for chills, fatigue and unexpected weight change.  HENT:  Negative for congestion, rhinorrhea, sneezing and sore throat.   Eyes:  Negative for redness.  Respiratory:  Negative for cough, chest tightness and shortness of breath.   Cardiovascular:  Negative  for chest pain and palpitations.  Gastrointestinal:  Negative for abdominal pain, constipation, diarrhea, nausea and vomiting.  Genitourinary:  Negative for dysuria and frequency.  Musculoskeletal:  Negative for arthralgias, back pain, joint swelling and neck pain.  Skin:  Negative for rash.  Neurological: Negative.  Negative for tremors and numbness.  Hematological:  Negative for adenopathy. Does not bruise/bleed easily.  Psychiatric/Behavioral:  Negative for behavioral problems (Depression), sleep disturbance and suicidal ideas. The patient is not nervous/anxious.    Vital Signs: BP 136/80    Pulse 69    Temp 98.2 F (36.8 C)    Resp 16    Ht 5\' 2"  (1.575 m)    Wt 161 lb (73 kg)    SpO2 98%    BMI 29.45 kg/m    Physical Exam Vitals reviewed.  Constitutional:      General: She is not in acute distress.    Appearance: Normal appearance. She is normal weight. She is not ill-appearing.  HENT:     Head: Normocephalic and atraumatic.  Eyes:     Pupils: Pupils are equal, round, and reactive to light.  Cardiovascular:     Rate and Rhythm: Normal rate and regular rhythm.  Pulmonary:     Effort: Pulmonary effort is normal. No respiratory distress.  Neurological:     Mental Status: She is alert and oriented to person, place, and time.  Psychiatric:        Mood and Affect: Mood normal.        Behavior: Behavior normal.       Assessment/Plan: 1. Type 2 diabetes mellitus with hyperglycemia, without long-term current use of insulin ([place]) New onset diabetes, A1C 6.4. patient provided with glucose meter. Test strips and lancets ordered. Follow up in 1 month. Informational handouts about diabetes, diet modifications and general diabetes care provided to patient with her AVS at check out.  - Accu-Chek Softclix Lancets lancets; Use 1 lancet to check glucose level at least once daily. Diag E11.65  Dispense: 100 each; Refill: 12 - glucose blood (ACCU-CHEK GUIDE) test strip; Use 1 test strip to  check blood glucose at least once daily. Diag E11.65  Dispense: 100 each; Refill: 12  2. Essential hypertension BP is stable, refills ordered. Continue medications as prescribed.  - atenolol (TENORMIN) 25 MG tablet; Take 1 tablet (25 mg total) by mouth 2 (two) times daily.  Dispense: 180 tablet; Refill: 1 - hydrochlorothiazide (HYDRODIURIL) 25 MG tablet; Take 1 tablet (25 mg total) by mouth daily.  Dispense: 90 tablet; Refill: 1  3. B12 deficiency B12 injection administered in office today. Recommend monthly B12 injections.  - cyanocobalamin ((VITAMIN B-12)) injection 1,000 mcg  4. Vitamin D deficiency Prescription for vitamin D sent to pharmacy, will repeat vitamin D level in 3 - 6 months.  - Vitamin D, Ergocalciferol, (DRISDOL) 1.25 MG (50000 UNIT) CAPS capsule; Take  1 capsule (50,000 Units total) by mouth every 7 (seven) days.  Dispense: 12 capsule; Refill: 1  5. Mixed hyperlipidemia Limit red meat intake, increase lean proteins, take OTC fish oil supplement 1000 mg daily. Patient was previously on rosuvastatin and stopped taking it, around July 2022. She was encouraged to restart the medication at her July visit due to hyperlipidemia. Will reorder the medication.  - rosuvastatin (CRESTOR) 10 MG tablet; Take 1 tablet (10 mg total) by mouth daily.  Dispense: 90 tablet; Refill: 1   General Counseling: Adrienne verbalizes understanding of the findings of todays visit and agrees with plan of treatment. I have discussed any further diagnostic evaluation that may be needed or ordered today. We also reviewed her medications today. she has been encouraged to call the office with any questions or concerns that should arise related to todays visit.    No orders of the defined types were placed in this encounter.   Meds ordered this encounter  Medications   Vitamin D, Ergocalciferol, (DRISDOL) 1.25 MG (50000 UNIT) CAPS capsule    Sig: Take 1 capsule (50,000 Units total) by mouth every 7 (seven)  days.    Dispense:  12 capsule    Refill:  1   atenolol (TENORMIN) 25 MG tablet    Sig: Take 1 tablet (25 mg total) by mouth 2 (two) times daily.    Dispense:  180 tablet    Refill:  1    **Patient requests 90 days supply**   hydrochlorothiazide (HYDRODIURIL) 25 MG tablet    Sig: Take 1 tablet (25 mg total) by mouth daily.    Dispense:  90 tablet    Refill:  1    ZERO refills remain on this prescription. Your patient is requesting advance approval of refills for this medication to PREVENT ANY MISSED DOSES   cyanocobalamin ((VITAMIN B-12)) injection 1,000 mcg   Accu-Chek Softclix Lancets lancets    Sig: Use 1 lancet to check glucose level at least once daily. Diag E11.65    Dispense:  100 each    Refill:  12    E11.65   glucose blood (ACCU-CHEK GUIDE) test strip    Sig: Use 1 test strip to check blood glucose at least once daily. Diag E11.65    Dispense:  100 each    Refill:  12    E11.65   rosuvastatin (CRESTOR) 10 MG tablet    Sig: Take 1 tablet (10 mg total) by mouth daily.    Dispense:  90 tablet    Refill:  1    Return in about 1 month (around 06/24/2021) for F/U new onset diabetes , Jody Silas PCP.   Total time spent:30 Minutes Time spent includes review of chart, medications, test results, and follow up plan with the patient.   [place] Controlled Substance Database was reviewed by me.  This patient was seen by Jonetta Osgood, FNP-C in collaboration with Dr. Clayborn Bigness as a part of collaborative care agreement.   Resean Brander R. Valetta Fuller, MSN, FNP-C Internal medicine

## 2021-05-27 NOTE — Progress Notes (Signed)
Spoke to pt, provided instructions on how to use glucometer and provided her with accu-chek machine

## 2021-06-18 ENCOUNTER — Encounter: Payer: Self-pay | Admitting: Nurse Practitioner

## 2021-06-18 MED ORDER — ACCU-CHEK SOFTCLIX LANCETS MISC: 12 refills | Status: AC

## 2021-06-18 MED ORDER — ROSUVASTATIN CALCIUM 10 MG PO TABS: 10.0000 mg | ORAL_TABLET | Freq: Every day | ORAL | 1 refills | Status: DC

## 2021-06-18 MED ORDER — ACCU-CHEK GUIDE VI STRP: ORAL_STRIP | 12 refills | Status: AC

## 2021-06-24 ENCOUNTER — Ambulatory Visit: Payer: 59 | Admitting: Nurse Practitioner

## 2021-09-16 ENCOUNTER — Encounter: Payer: Self-pay | Admitting: Nurse Practitioner

## 2021-09-16 ENCOUNTER — Ambulatory Visit: Payer: 59 | Admitting: Nurse Practitioner

## 2021-09-16 VITALS — BP 135/73 | HR 68 | Temp 97.8°F | Resp 16 | Ht 62.0 in | Wt 157.0 lb

## 2021-09-16 DIAGNOSIS — R140 Abdominal distension (gaseous): Secondary | ICD-10-CM | POA: Diagnosis not present

## 2021-09-16 DIAGNOSIS — K5909 Other constipation: Secondary | ICD-10-CM

## 2021-09-16 DIAGNOSIS — I10 Essential (primary) hypertension: Secondary | ICD-10-CM

## 2021-09-16 NOTE — Progress Notes (Signed)
[facility] [address] [place], [place] [address]  Internal MEDICINE  Office Visit Note  Patient Name: Ebony Hutchinson  [id number]  [id number]  Date of Service: 09/16/2021  Chief Complaint  Patient presents with   Acute Visit   Constipation   Abdominal Pain     HPI Ebony Hutchinson presents for an acute sick visit for constipation, persistent abdominal bloating, tightness and tenderness.  She reports that she has recently been feeling constipated for the past couple of weeks she tried MiraLAX first.  She then started using Metamucil daily and her abdomen felt like it was more bloated and tight and painful.  She has not used Metamucil today and does feel some slight relief.  She reports that her stools are soft and brown and she does not have to strain when she is having a bowel movement.  She reports that she does have occasional heartburn.  She denies having any abdominal pain while eating or immediately after eating.  She denies any change in her appetite and denies any nausea and vomiting.  She does still have her gallbladder and has no history of cholangitis or any history of gallstones.  She did notice that she has increased abdominal pain when she is bearing down and attempting to have a bowel movement. --Patient was previously seen in February this year.  She was prescribed a weekly vitamin D 50,000 unit capsule supplement to replenish the significantly low vitamin D level that she has but has not gone to the pharmacy to pick up the prescription.  Also at her previous office visit she was prescribed rosuvastatin 10 mg for hyperlipidemia and she did not pick up this medication either.  She has been taking her prescribed blood pressure medications and her blood pressure is stable and within normal limits.  At her previous office visit she also had her A1c checked and it was elevated at 6.4.  Patient was instructed to start checking her glucose level once daily first thing in the morning  prior to eating but she has not started doing this either. --Patient has been eating many different foods that can be found on the list detailed for FODMAP that can cause bloating or increased bloating when eating.  There are several possible etiologies that could be causing her symptoms.    Current Medication:  Outpatient Encounter Medications as of 09/16/2021  Medication Sig   Accu-Chek Softclix Lancets lancets Use 1 lancet to check glucose level at least once daily. Diag E11.65   atenolol (TENORMIN) 25 MG tablet Take 1 tablet (25 mg total) by mouth 2 (two) times daily.   glucose blood (ACCU-CHEK GUIDE) test strip Use 1 test strip to check blood glucose at least once daily. Diag E11.65   hydrochlorothiazide (HYDRODIURIL) 25 MG tablet Take 1 tablet (25 mg total) by mouth daily.   polyethylene glycol powder (GLYCOLAX/MIRALAX) 17 GM/SCOOP powder Take 17 g by mouth 2 (two) times daily as needed for moderate constipation or severe constipation.   rosuvastatin (CRESTOR) 10 MG tablet Take 1 tablet (10 mg total) by mouth daily.   Vitamin D, Ergocalciferol, (DRISDOL) 1.25 MG (50000 UNIT) CAPS capsule Take 1 capsule (50,000 Units total) by mouth every 7 (seven) days.   No facility-administered encounter medications on file as of 09/16/2021.      Medical History: Past Medical History:  Diagnosis Date   Hypertension      Vital Signs: BP 135/73   Pulse 68   Temp 97.8 F (36.6 C)  Resp 16   Ht 5\' 2"  (1.575 m)   Wt 157 lb (71.2 kg)   SpO2 97%   BMI 28.72 kg/m    Review of Systems  Constitutional:  Negative for chills, fatigue and unexpected weight change.  HENT:  Negative for congestion, rhinorrhea, sneezing and sore throat.   Eyes:  Negative for redness.  Respiratory: Negative.  Negative for cough, chest tightness, shortness of breath and wheezing.   Cardiovascular: Negative.  Negative for chest pain and palpitations.  Gastrointestinal:  Positive for abdominal distention and  constipation. Negative for abdominal pain, blood in stool, diarrhea, nausea and vomiting.  Genitourinary:  Negative for dysuria and frequency.  Musculoskeletal:  Negative for arthralgias, back pain, joint swelling and neck pain.  Skin:  Negative for rash.  Neurological: Negative.  Negative for tremors and numbness.  Hematological:  Negative for adenopathy. Does not bruise/bleed easily.  Psychiatric/Behavioral:  Negative for behavioral problems (Depression), sleep disturbance and suicidal ideas. The patient is not nervous/anxious.     Physical Exam Vitals reviewed.  Constitutional:      General: She is not in acute distress.    Appearance: Normal appearance. She is well-developed and normal weight. She is not ill-appearing.  HENT:     Head: Normocephalic and atraumatic.  Eyes:     Pupils: Pupils are equal, round, and reactive to light.  Cardiovascular:     Rate and Rhythm: Normal rate and regular rhythm.  Pulmonary:     Effort: Pulmonary effort is normal. No respiratory distress.  Abdominal:     General: Bowel sounds are normal. There is distension.     Palpations: Abdomen is soft.     Tenderness: There is no abdominal tenderness.  Neurological:     Mental Status: She is alert and oriented to person, place, and time.  Psychiatric:        Mood and Affect: Mood normal.        Behavior: Behavior normal.       Assessment/Plan: 1. Abdominal bloating Provided patient with information about abdominal bloating and constipation and nonpharmacologic interventions as well as over-the-counter medications that can be used to alleviate bloating and constipation.  Also provided patient with information about low FODMAP eating  2. Other constipation Discussed nonpharmacologic interventions to alleviate constipation as well as over-the-counter medications that can be used as well.  Samples of MiraLAX and Linzess were provided to the patient as well  3. Essential hypertension Blood pressure  is stable and controlled on current medications   General Counseling: [name] verbalizes understanding of the findings of todays visit and agrees with plan of treatment. I have discussed any further diagnostic evaluation that may be needed or ordered today. We also reviewed her medications today. she has been encouraged to call the office with any questions or concerns that should arise related to todays visit.    Counseling:    No orders of the defined types were placed in this encounter.   No orders of the defined types were placed in this encounter.   Return if symptoms worsen or fail to improve.  [place] Controlled Substance Database was reviewed by me for overdose risk score (ORS)  Time spent:30 Minutes Time spent with patient included reviewing progress notes, labs, imaging studies, and discussing plan for follow up.   This patient was seen by , FNP-C in collaboration with Dr. Sallyanne Kuster as a part of collaborative care agreement.  Wendelin Reader R. Beverely Risen, MSN, FNP-C Internal Medicine

## 2021-09-27 ENCOUNTER — Ambulatory Visit
Admission: RE | Admit: 2021-09-27 | Discharge: 2021-09-27 | Disposition: A | Discharge status: Home / Self Care | Payer: 59 | Source: Ambulatory Visit | Attending: Nurse Practitioner | Admitting: Nurse Practitioner

## 2021-09-27 ENCOUNTER — Encounter: Payer: Self-pay | Admitting: Nurse Practitioner

## 2021-09-27 ENCOUNTER — Ambulatory Visit
Admission: RE | Admit: 2021-09-27 | Discharge: 2021-09-27 | Disposition: A | Discharge status: Home / Self Care | Payer: 59 | Attending: Nurse Practitioner | Admitting: Nurse Practitioner

## 2021-09-27 ENCOUNTER — Ambulatory Visit: Payer: 59 | Admitting: Nurse Practitioner

## 2021-09-27 VITALS — BP 130/71 | HR 71 | Temp 98.4°F | Resp 16 | Ht 62.0 in | Wt 155.2 lb

## 2021-09-27 DIAGNOSIS — K5909 Other constipation: Secondary | ICD-10-CM

## 2021-09-27 DIAGNOSIS — R140 Abdominal distension (gaseous): Secondary | ICD-10-CM

## 2021-09-27 DIAGNOSIS — K5900 Constipation, unspecified: Secondary | ICD-10-CM | POA: Diagnosis not present

## 2021-09-27 MED ORDER — LUBIPROSTONE 24 MCG PO CAPS: 24.0000 ug | ORAL_CAPSULE | Freq: Two times a day (BID) | ORAL | 2 refills | Status: DC

## 2021-09-27 NOTE — Progress Notes (Signed)
[facility] [address] [place], [place] [address]  Internal MEDICINE  Office Visit Note  Patient Name: Ebony Hutchinson  [id number]  [id number]  Date of Service: 09/27/2021  Chief Complaint  Patient presents with   Hypertension   Medication Refill   Follow-up    Discuss bowel movements    HPI Ebony Hutchinson presents with no significant improvement in constipation.  She tried the samples of Linzess and took all of the samples and only had one very small hard bowel movement.  She reports that she continues to have abdominal bloating and indigestion and feels like her abdomen is distended and tight.  At this point it would be prudent to evaluate for any small bowel obstruction or fecal impaction.  It would also be helpful to try a different medication to alleviate constipation    Current Medication: Outpatient Encounter Medications as of 09/27/2021  Medication Sig   Accu-Chek Softclix Lancets lancets Use 1 lancet to check glucose level at least once daily. Diag E11.65   atenolol (TENORMIN) 25 MG tablet Take 1 tablet (25 mg total) by mouth 2 (two) times daily.   glucose blood (ACCU-CHEK GUIDE) test strip Use 1 test strip to check blood glucose at least once daily. Diag E11.65   hydrochlorothiazide (HYDRODIURIL) 25 MG tablet Take 1 tablet (25 mg total) by mouth daily.   lubiprostone (AMITIZA) 24 MCG capsule Take 1 capsule (24 mcg total) by mouth 2 (two) times daily with a meal.   polyethylene glycol powder (GLYCOLAX/MIRALAX) 17 GM/SCOOP powder Take 17 g by mouth 2 (two) times daily as needed for moderate constipation or severe constipation.   rosuvastatin (CRESTOR) 10 MG tablet Take 1 tablet (10 mg total) by mouth daily.   Vitamin D, Ergocalciferol, (DRISDOL) 1.25 MG (50000 UNIT) CAPS capsule Take 1 capsule (50,000 Units total) by mouth every 7 (seven) days.   No facility-administered encounter medications on file as of 09/27/2021.    Surgical History: Past Surgical History:   Procedure Laterality Date   ABDOMINAL HYSTERECTOMY  2011    Medical History: Past Medical History:  Diagnosis Date   Hypertension     Family History: Family History  Problem Relation Age of Onset   Colon cancer Mother        mom had colon cancer and it was removed   Heart Problems Father    Heart attack Brother    Breast cancer Sister     Social History   Socioeconomic History   Marital status: Divorced    Spouse name: Not on file   Number of children: Not on file   Years of education: Not on file   Highest education level: Not on file  Occupational History   Not on file  Tobacco Use   Smoking status: Never   Smokeless tobacco: Never  Substance and Sexual Activity   Alcohol use: Yes    Comment: occasionally   Drug use: No   Sexual activity: Not on file  Other Topics Concern   Not on file  Social History Narrative   Not on file   Social Determinants of Health   Financial Resource Strain: Not on file  Food Insecurity: Not on file  Transportation Needs: Not on file  Physical Activity: Not on file  Stress: Not on file  Social Connections: Not on file  Intimate Partner Violence: Not on file      Review of Systems  Constitutional:  Positive for appetite change. Negative for chills, fatigue and fever.  HENT: Negative.    Respiratory: Negative.  Negative for cough, chest tightness, shortness of breath and wheezing.   Cardiovascular: Negative.  Negative for chest pain and palpitations.  Gastrointestinal:  Positive for abdominal distention, abdominal pain, constipation and nausea. Negative for diarrhea and vomiting.  Endocrine: Negative.   Genitourinary: Negative.  Negative for difficulty urinating, flank pain, frequency, pelvic pain and urgency.  Neurological: Negative.  Negative for headaches.  Hematological: Negative.    Vital Signs: BP 130/71   Pulse 71   Temp 98.4 F (36.9 C)   Resp 16   Ht 5\' 2"  (1.575 m)   Wt 155 lb 3.2 oz (70.4 kg)   SpO2 99%    BMI 28.39 kg/m    Physical Exam Vitals reviewed.  Constitutional:      General: She is not in acute distress.    Appearance: Normal appearance. She is obese. She is not ill-appearing.  HENT:     Head: Normocephalic and atraumatic.  Eyes:     Pupils: Pupils are equal, round, and reactive to light.  Cardiovascular:     Rate and Rhythm: Normal rate and regular rhythm.  Pulmonary:     Effort: Pulmonary effort is normal. No respiratory distress.  Abdominal:     General: Bowel sounds are normal. There is distension.     Tenderness: There is abdominal tenderness.  Neurological:     Mental Status: She is alert and oriented to person, place, and time.  Psychiatric:        Mood and Affect: Mood normal.        Behavior: Behavior normal.       Assessment/Plan: 1. Abdominal bloating Rule out bowel obstruction, fecal impaction or or other significant abnormality - DG Abd 2 Views; Future  2. Other constipation Abdominal x-ray ordered and Amitiza prescribed for constipation related to irritable bowel syndrome.  Follow-up if no improvement or worsening of symptoms. - DG Abd 2 Views; Future - lubiprostone (AMITIZA) 24 MCG capsule; Take 1 capsule (24 mcg total) by mouth 2 (two) times daily with a meal.  Dispense: 60 capsule; Refill: 2   General Counseling: Ebony Hutchinson verbalizes understanding of the findings of todays visit and agrees with plan of treatment. I have discussed any further diagnostic evaluation that may be needed or ordered today. We also reviewed her medications today. she has been encouraged to call the office with any questions or concerns that should arise related to todays visit.    Orders Placed This Encounter  Procedures   DG Abd 2 Views    Meds ordered this encounter  Medications   lubiprostone (AMITIZA) 24 MCG capsule    Sig: Take 1 capsule (24 mcg total) by mouth 2 (two) times daily with a meal.    Dispense:  60 capsule    Refill:  2    Please fill new  prescription asap.    Return if symptoms worsen or fail to improve.   Total time spent:30 Minutes Time spent includes review of chart, medications, test results, and follow up plan with the patient.   [place] Controlled Substance Database was reviewed by me.  This patient was seen by , FNP-C in collaboration with Dr. Sallyanne Kuster as a part of collaborative care agreement.   Mayre Bury R. Beverely Risen, MSN, FNP-C Internal medicine

## 2021-10-31 ENCOUNTER — Encounter: Payer: Self-pay | Admitting: Nurse Practitioner

## 2021-11-07 ENCOUNTER — Telehealth: Payer: Self-pay

## 2021-11-07 NOTE — Telephone Encounter (Signed)
Left voicemail asking patient if they would be interested in coming in for a eye exam tomorrow afternoon 11/08/21.

## 2021-11-29 ENCOUNTER — Other Ambulatory Visit: Payer: Self-pay | Admitting: Nurse Practitioner

## 2021-11-29 DIAGNOSIS — I10 Essential (primary) hypertension: Secondary | ICD-10-CM

## 2021-12-22 ENCOUNTER — Other Ambulatory Visit: Payer: Self-pay | Admitting: Nurse Practitioner

## 2021-12-22 DIAGNOSIS — K5909 Other constipation: Secondary | ICD-10-CM

## 2022-01-17 ENCOUNTER — Encounter: Payer: 59 | Admitting: Nurse Practitioner

## 2022-01-25 ENCOUNTER — Telehealth: Payer: Self-pay | Admitting: Nurse Practitioner

## 2022-01-25 NOTE — Telephone Encounter (Signed)
Left vm to confirm 01/27/22 appointment-Toni

## 2022-01-27 ENCOUNTER — Encounter: Payer: 59 | Admitting: Nurse Practitioner

## 2022-02-27 IMAGING — CR DG ABDOMEN 2V
1 series · 2 of 2 positions shown · non-contrast
Comparison: CT 05/26/2013

CLINICAL DATA: Constipation

EXAM:
ABDOMEN - 2 VIEW

[Series 1: dg abd 2 views · 0.14mm/px · 2 of 2 slices shown]
[im 1/2]
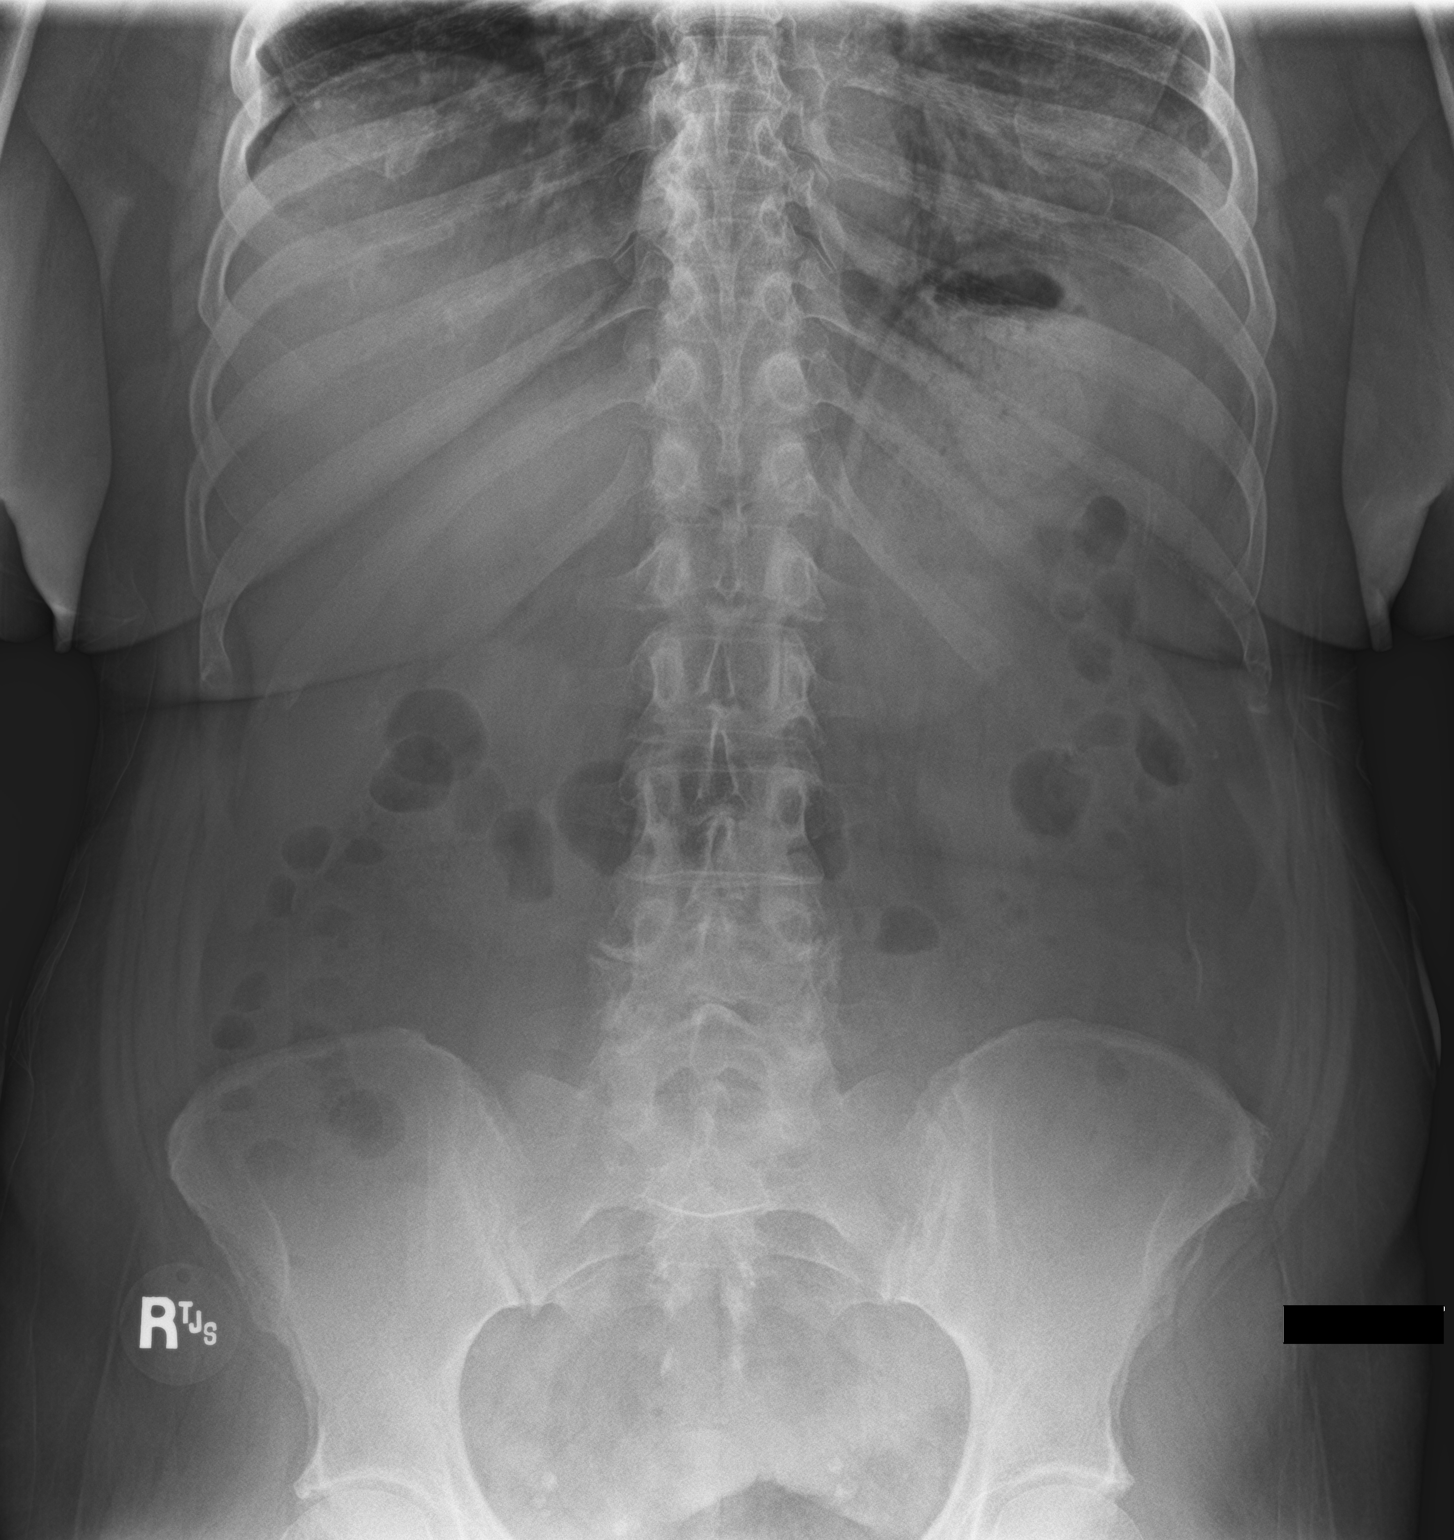
[im 2/2]
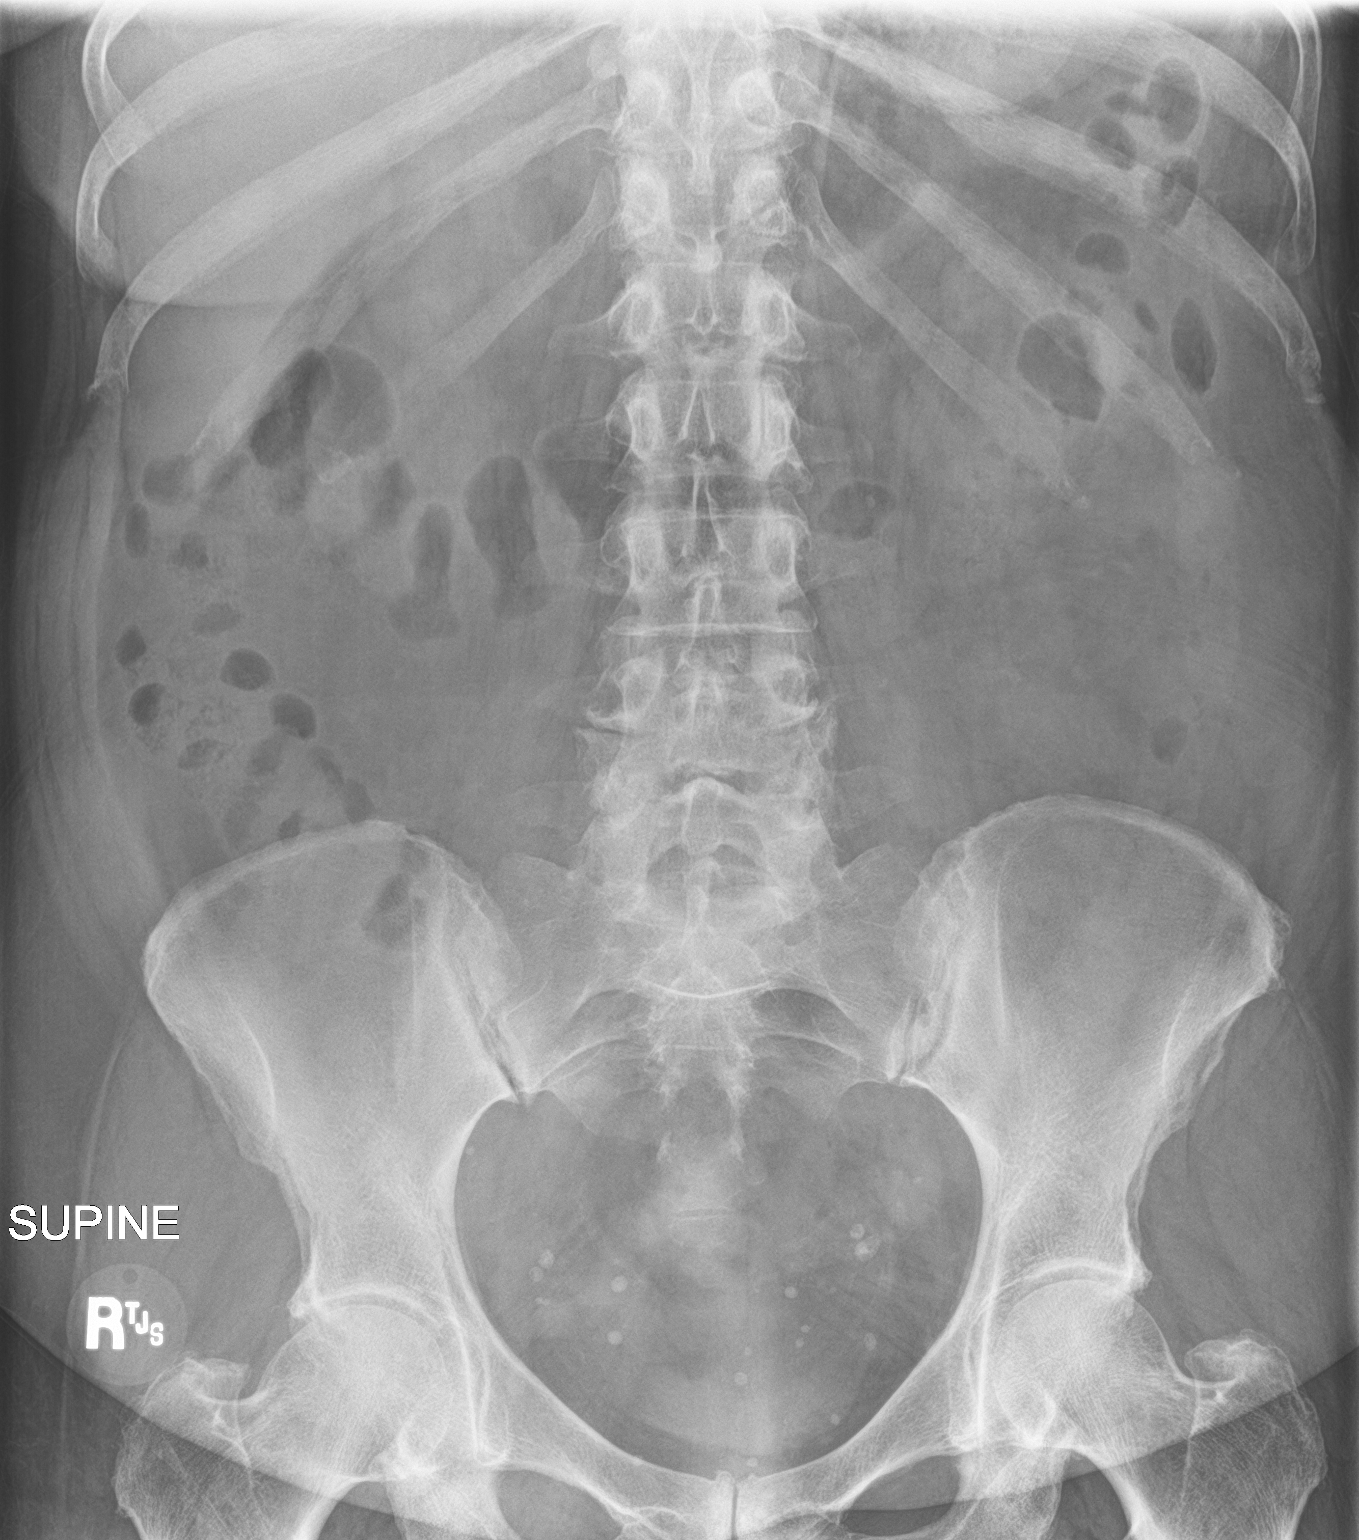

[2 of 2 positions shown; findings below may reference images not displayed]

FINDINGS: No free air beneath the diaphragm. Nonobstructed gas pattern with
mild stool burden. Phleboliths in the pelvis
IMPRESSION: Negative.  Mild stool burden

## 2022-06-12 ENCOUNTER — Other Ambulatory Visit: Payer: Self-pay | Admitting: Nurse Practitioner

## 2022-06-12 DIAGNOSIS — I10 Essential (primary) hypertension: Secondary | ICD-10-CM

## 2022-06-13 ENCOUNTER — Ambulatory Visit: Payer: 59 | Admitting: Nurse Practitioner

## 2022-06-13 ENCOUNTER — Encounter: Payer: Self-pay | Admitting: Nurse Practitioner

## 2022-06-13 VITALS — BP 131/72 | HR 75 | Temp 97.7°F | Resp 16 | Ht 62.0 in | Wt 151.4 lb

## 2022-06-13 DIAGNOSIS — E538 Deficiency of other specified B group vitamins: Secondary | ICD-10-CM

## 2022-06-13 DIAGNOSIS — Z1231 Encounter for screening mammogram for malignant neoplasm of breast: Secondary | ICD-10-CM | POA: Diagnosis not present

## 2022-06-13 DIAGNOSIS — E559 Vitamin D deficiency, unspecified: Secondary | ICD-10-CM | POA: Diagnosis not present

## 2022-06-13 DIAGNOSIS — Z1212 Encounter for screening for malignant neoplasm of rectum: Secondary | ICD-10-CM

## 2022-06-13 DIAGNOSIS — E1165 Type 2 diabetes mellitus with hyperglycemia: Secondary | ICD-10-CM

## 2022-06-13 DIAGNOSIS — E782 Mixed hyperlipidemia: Secondary | ICD-10-CM | POA: Diagnosis not present

## 2022-06-13 DIAGNOSIS — E2839 Other primary ovarian failure: Secondary | ICD-10-CM

## 2022-06-13 DIAGNOSIS — Z1211 Encounter for screening for malignant neoplasm of colon: Secondary | ICD-10-CM | POA: Diagnosis not present

## 2022-06-13 DIAGNOSIS — I10 Essential (primary) hypertension: Secondary | ICD-10-CM | POA: Diagnosis not present

## 2022-06-13 MED ORDER — ATENOLOL 25 MG PO TABS: 25.0000 mg | ORAL_TABLET | Freq: Two times a day (BID) | ORAL | 1 refills | Status: DC

## 2022-06-13 MED ORDER — HYDROCHLOROTHIAZIDE 25 MG PO TABS: 25.0000 mg | ORAL_TABLET | Freq: Every day | ORAL | 1 refills | Status: DC

## 2022-06-13 NOTE — Progress Notes (Unsigned)
[facility] [place], [place] [address]  Internal MEDICINE  Office Visit Note  Patient Name: Ebony Hutchinson  [id number]  [id number]  Date of Service: 06/13/2022  Chief Complaint  Patient presents with   Follow-up   Hypertension    HPI Ebony Hutchinson presents for a follow-up visit for Constipation is resolved. Due for routine labs  Diabetes  Hypertension -- stable, on atenolol and HCTZ.     Current Medication: Outpatient Encounter Medications as of 06/13/2022  Medication Sig   Accu-Chek Softclix Lancets lancets Use 1 lancet to check glucose level at least once daily. Diag E11.65   atenolol (TENORMIN) 25 MG tablet TAKE 1 TABLET BY MOUTH TWICE A DAY   glucose blood (ACCU-CHEK GUIDE) test strip Use 1 test strip to check blood glucose at least once daily. Diag E11.65   hydrochlorothiazide (HYDRODIURIL) 25 MG tablet TAKE 1 TABLET (25 MG TOTAL) BY MOUTH DAILY.   lubiprostone (AMITIZA) 24 MCG capsule TAKE 1 CAPSULE (24 MCG TOTAL) BY MOUTH 2 (TWO) TIMES DAILY WITH A MEAL.   polyethylene glycol powder (GLYCOLAX/MIRALAX) 17 GM/SCOOP powder Take 17 g by mouth 2 (two) times daily as needed for moderate constipation or severe constipation.   rosuvastatin (CRESTOR) 10 MG tablet Take 1 tablet (10 mg total) by mouth daily.   Vitamin D, Ergocalciferol, (DRISDOL) 1.25 MG (50000 UNIT) CAPS capsule Take 1 capsule (50,000 Units total) by mouth every 7 (seven) days.   No facility-administered encounter medications on file as of 06/13/2022.    Surgical History: Past Surgical History:  Procedure Laterality Date   ABDOMINAL HYSTERECTOMY  2011    Medical History: Past Medical History:  Diagnosis Date   Hypertension     Family History: Family History  Problem Relation Age of Onset   Colon cancer Mother        mom had colon cancer and it was removed   Heart Problems Father    Heart attack Brother    Breast cancer Sister     Social History   Socioeconomic History    Marital status: Divorced    Spouse name: Not on file   Number of children: Not on file   Years of education: Not on file   Highest education level: Not on file  Occupational History   Not on file  Tobacco Use   Smoking status: Never   Smokeless tobacco: Never  Substance and Sexual Activity   Alcohol use: Yes    Comment: occasionally   Drug use: No   Sexual activity: Not on file  Other Topics Concern   Not on file  Social History Narrative   Not on file   Social Determinants of Health   Financial Resource Strain: Not on file  Food Insecurity: Not on file  Transportation Needs: Not on file  Physical Activity: Not on file  Stress: Not on file  Social Connections: Not on file  Intimate Partner Violence: Not on file      Review of Systems  Constitutional:  Negative for chills, fatigue and unexpected weight change.  HENT:  Negative for congestion, rhinorrhea, sneezing and sore throat.   Eyes:  Negative for redness.  Respiratory: Negative.  Negative for cough, chest tightness, shortness of breath and wheezing.   Cardiovascular: Negative.  Negative for chest pain and palpitations.  Gastrointestinal:  Negative for abdominal distention, abdominal pain, blood in stool, constipation, diarrhea, nausea and vomiting.  Genitourinary:  Negative for dysuria and frequency.  Musculoskeletal:  Negative for arthralgias, back  pain, joint swelling and neck pain.  Skin:  Negative for rash.  Neurological: Negative.  Negative for tremors and numbness.  Hematological:  Negative for adenopathy. Does not bruise/bleed easily.  Psychiatric/Behavioral:  Negative for behavioral problems (Depression), sleep disturbance and suicidal ideas. The patient is not nervous/anxious.     Vital Signs: BP 131/72   Pulse 75   Temp 97.7 F (36.5 C)   Resp 16   Ht 5' 2"$  (1.575 m)   Wt 151 lb 6.4 oz (68.7 kg)   SpO2 99%   BMI 27.69 kg/m    Physical Exam Vitals reviewed.  Constitutional:      General:  She is not in acute distress.    Appearance: Normal appearance. She is obese. She is not ill-appearing.  HENT:     Head: Normocephalic and atraumatic.  Eyes:     Pupils: Pupils are equal, round, and reactive to light.  Cardiovascular:     Rate and Rhythm: Normal rate and regular rhythm.  Pulmonary:     Effort: Pulmonary effort is normal. No respiratory distress.  Abdominal:     General: Bowel sounds are normal. There is no distension.     Tenderness: There is no abdominal tenderness.  Neurological:     Mental Status: She is alert and oriented to person, place, and time.  Psychiatric:        Mood and Affect: Mood normal.        Behavior: Behavior normal.        Assessment/Plan:   General Counseling: Ebony Hutchinson verbalizes understanding of the findings of todays visit and agrees with plan of treatment. I have discussed any further diagnostic evaluation that may be needed or ordered today. We also reviewed her medications today. she has been encouraged to call the office with any questions or concerns that should arise related to todays visit.    No orders of the defined types were placed in this encounter.   No orders of the defined types were placed in this encounter.   No follow-ups on file.   Total time spent:30 Minutes Time spent includes review of chart, medications, test results, and follow up plan with the patient.   [place] Controlled Substance Database was reviewed by me.  This patient was seen by Jonetta Osgood, FNP-C in collaboration with Dr. Clayborn Bigness as a part of collaborative care agreement.   Ebony Johann R. Valetta Fuller, MSN, FNP-C Internal medicine

## 2022-06-14 ENCOUNTER — Encounter: Payer: Self-pay | Admitting: Nurse Practitioner

## 2022-06-14 ENCOUNTER — Ambulatory Visit: Payer: 59 | Admitting: Nurse Practitioner

## 2022-06-16 ENCOUNTER — Telehealth: Payer: Self-pay

## 2022-06-16 NOTE — Telephone Encounter (Signed)
Contacted patient to schedule her colonoscopy.  She said she is undecided on whether she is going to have her colonoscopy.  Informed her that I will send her a reminder letter and if she decides to have her colonoscopy she can call back to schedule.  Thanks, [place], [place]

## 2022-08-07 ENCOUNTER — Encounter: Payer: 59 | Admitting: Nurse Practitioner

## 2022-08-08 ENCOUNTER — Encounter: Payer: Self-pay | Admitting: Nurse Practitioner

## 2022-08-08 ENCOUNTER — Ambulatory Visit (INDEPENDENT_AMBULATORY_CARE_PROVIDER_SITE_OTHER): Payer: 59 | Admitting: Nurse Practitioner

## 2022-08-08 VITALS — BP 148/82 | HR 79 | Temp 97.4°F | Resp 16 | Ht 62.0 in | Wt 146.3 lb

## 2022-08-08 DIAGNOSIS — L563 Solar urticaria: Secondary | ICD-10-CM | POA: Diagnosis not present

## 2022-08-08 MED ORDER — TRIAMCINOLONE ACETONIDE 0.025 % EX OINT
1.0000 | TOPICAL_OINTMENT | Freq: Two times a day (BID) | CUTANEOUS | 4 refills | Status: AC
Start: 2022-08-08 — End: ?

## 2022-08-08 NOTE — Progress Notes (Signed)
[facility] [address] [place], [place] [address]  Internal MEDICINE  Office Visit Note  Patient Name: Ebony Hutchinson  [id number]  [id number]  Date of Service: 08/08/2022  Chief Complaint  Patient presents with   Acute Visit    Possible sun poison.      HPI Ebony Hutchinson presents for an acute sick visit for Sun poisoning. Recently went to the [place] and had significant sun exposure.  Rash on extremities -- papular Rash on chest -- red papular and vesicular Rash on right neck -- red, papular and vesicular.  Slightly itchy, not painful.   Current Medication:  Outpatient Encounter Medications as of 08/08/2022  Medication Sig   triamcinolone (KENALOG) 0.025 % ointment Apply 1 Application topically 2 (two) times daily. To affected area Until rash resolves   Accu-Chek Softclix Lancets lancets Use 1 lancet to check glucose level at least once daily. Diag E11.65   atenolol (TENORMIN) 25 MG tablet Take 1 tablet (25 mg total) by mouth 2 (two) times daily.   glucose blood (ACCU-CHEK GUIDE) test strip Use 1 test strip to check blood glucose at least once daily. Diag E11.65   hydrochlorothiazide (HYDRODIURIL) 25 MG tablet Take 1 tablet (25 mg total) by mouth daily.   No facility-administered encounter medications on file as of 08/08/2022.      Medical History: Past Medical History:  Diagnosis Date   Hypertension      Vital Signs: BP (!) 148/82   Pulse 79   Temp (!) 97.4 F (36.3 C)   Resp 16   Ht  (1.575 m)   Wt 146 lb 4.8 oz (66.4 kg)   SpO2 97%   BMI 26.76 kg/m    Review of Systems  Constitutional:  Negative for chills, fatigue and unexpected weight change.  HENT:  Negative for congestion, rhinorrhea, sneezing and sore throat.   Eyes:  Negative for redness.  Respiratory:  Negative for cough, chest tightness and shortness of breath.   Cardiovascular:  Negative for chest pain and palpitations.  Gastrointestinal:  Negative for abdominal pain,  constipation, diarrhea, nausea and vomiting.  Genitourinary:  Negative for dysuria and frequency.  Musculoskeletal:  Negative for arthralgias, back pain, joint swelling and neck pain.  Skin:  Positive for rash.  Neurological: Negative.  Negative for tremors and numbness.  Hematological:  Negative for adenopathy. Does not bruise/bleed easily.  Psychiatric/Behavioral:  Negative for behavioral problems (Depression), sleep disturbance and suicidal ideas. The patient is not nervous/anxious.     Physical Exam Vitals reviewed.  Constitutional:      General: She is not in acute distress.    Appearance: Normal appearance. She is not ill-appearing.  HENT:     Head: Normocephalic and atraumatic.  Eyes:     Pupils: Pupils are equal, round, and reactive to light.  Cardiovascular:     Rate and Rhythm: Normal rate and regular rhythm.  Pulmonary:     Effort: Pulmonary effort is normal. No respiratory distress.  Skin:    Findings: Rash present. Rash is papular and vesicular.          Comments: Vesicular rash in marked area, minimal papular rash on extremities.  Rash is slightly itchy, not painful.   Neurological:     Mental Status: She is alert and oriented to person, place, and time.  Psychiatric:        Mood and Affect: Mood normal.        Behavior: Behavior normal.  Assessment/Plan: 1. Solar urticaria Topical steroid ordered, apply daily or twice daily for at least 2 weeks. Call clinic if no improvement in 2 weeks.  - triamcinolone (KENALOG) 0.025 % ointment; Apply 1 Application topically 2 (two) times daily. To affected area Until rash resolves  Dispense: 80 g; Refill: 4   General Counseling: Ebony Hutchinson verbalizes understanding of the findings of todays visit and agrees with plan of treatment. I have discussed any further diagnostic evaluation that may be needed or ordered today. We also reviewed her medications today. she has been encouraged to call the office with any questions or  concerns that should arise related to todays visit.    Counseling:    No orders of the defined types were placed in this encounter.   Meds ordered this encounter  Medications   triamcinolone (KENALOG) 0.025 % ointment    Sig: Apply 1 Application topically 2 (two) times daily. To affected area Until rash resolves    Dispense:  80 g    Refill:  4    Return if symptoms worsen or fail to improve.  [place] Controlled Substance Database was reviewed by me for overdose risk score (ORS)  Time spent:20 Minutes Time spent with patient included reviewing progress notes, labs, imaging studies, and discussing plan for follow up.   This patient was seen by Sallyanne Kuster, FNP-C in collaboration with Dr. Beverely Risen as a part of collaborative care agreement.  Angelyn Osterberg R. Tedd Sias, MSN, FNP-C Internal Medicine

## 2022-08-09 ENCOUNTER — Other Ambulatory Visit: Payer: Self-pay | Admitting: Nurse Practitioner

## 2022-08-09 MED ORDER — VALACYCLOVIR HCL 1 G PO TABS: 1000.0000 mg | ORAL_TABLET | Freq: Two times a day (BID) | ORAL | 0 refills | Status: AC

## 2022-08-11 ENCOUNTER — Telehealth: Payer: Self-pay

## 2022-08-11 NOTE — Telephone Encounter (Signed)
Pt advised that diabetic eye exam is normal  

## 2022-08-16 ENCOUNTER — Other Ambulatory Visit: Payer: Self-pay

## 2022-08-16 ENCOUNTER — Emergency Department
Admission: EM | Admit: 2022-08-16 | Discharge: 2022-08-16 | Disposition: A | Discharge status: Home / Self Care | Payer: 59 | Attending: Emergency Medicine | Admitting: Emergency Medicine

## 2022-08-16 DIAGNOSIS — I10 Essential (primary) hypertension: Secondary | ICD-10-CM | POA: Insufficient documentation

## 2022-08-16 DIAGNOSIS — B029 Zoster without complications: Secondary | ICD-10-CM | POA: Insufficient documentation

## 2022-08-16 DIAGNOSIS — R21 Rash and other nonspecific skin eruption: Secondary | ICD-10-CM | POA: Diagnosis present

## 2022-08-16 LAB — BASIC METABOLIC PANEL WITH GFR
Anion gap: 11 (ref 5–15)
BUN: 15 mg/dL (ref 8–23)
CO2: 26 mmol/L (ref 22–32)
Calcium: 9.1 mg/dL (ref 8.9–10.3)
Chloride: 100 mmol/L (ref 98–111)
Creatinine, Ser: 0.85 mg/dL (ref 0.44–1.00)
GFR, Estimated: >60 mL/min
Glucose, Bld: 131 mg/dL — ABNORMAL HIGH (ref 70–99)
Potassium: 3.6 mmol/L (ref 3.5–5.1)
Sodium: 137 mmol/L (ref 135–145)

## 2022-08-16 LAB — CBC
HCT: 44.3 % (ref 36.0–46.0)
Hemoglobin: 14.8 g/dL (ref 12.0–15.0)
MCH: 30.8 pg (ref 26.0–34.0)
MCHC: 33.4 g/dL (ref 30.0–36.0)
MCV: 92.1 fL (ref 80.0–100.0)
Platelets: 255 10*3/uL (ref 150–400)
RBC: 4.81 MIL/uL (ref 3.87–5.11)
RDW: 12.1 % (ref 11.5–15.5)
WBC: 4.5 10*3/uL (ref 4.0–10.5)
nRBC: 0 % (ref 0.0–0.2)

## 2022-08-16 MED ORDER — LACTATED RINGERS IV BOLUS
1000.0000 mL | Freq: Once | INTRAVENOUS | Status: AC
  Administered 2022-08-16: 1000 mL via INTRAVENOUS

## 2022-08-16 MED ORDER — OXYCODONE-ACETAMINOPHEN 5-325 MG PO TABS
1.0000 | ORAL_TABLET | Freq: Once | ORAL | Status: AC
  Administered 2022-08-16: 1 via ORAL
  Filled 2022-08-16: qty 1

## 2022-08-16 MED ORDER — ONDANSETRON 4 MG PO TBDP: 4.0000 mg | ORAL_TABLET | Freq: Three times a day (TID) | ORAL | 0 refills | Status: AC | PRN

## 2022-08-16 MED ORDER — ONDANSETRON 4 MG PO TBDP
4.0000 mg | ORAL_TABLET | Freq: Once | ORAL | Status: AC
  Administered 2022-08-16: 4 mg via ORAL
  Filled 2022-08-16: qty 1

## 2022-08-16 MED ORDER — OXYCODONE HCL 5 MG PO TABS: 5.0000 mg | ORAL_TABLET | Freq: Three times a day (TID) | ORAL | 0 refills | Status: AC | PRN

## 2022-08-16 MED ORDER — METOCLOPRAMIDE HCL 5 MG/ML IJ SOLN
10.0000 mg | Freq: Once | INTRAMUSCULAR | Status: AC
  Administered 2022-08-16: 10 mg via INTRAVENOUS
  Filled 2022-08-16: qty 2

## 2022-08-16 NOTE — ED Triage Notes (Signed)
Pt to ED for emesis x1 today, and shingles x1 week. Reports pain to right side of body, reports recently dx with shingles.

## 2022-08-16 NOTE — ED Notes (Signed)
Pt given crackers and a gingerale to sip, pt is sitting on the side of the bed with an emesis bag and states that she hasn't eaten today and is feeling nauseated

## 2022-08-16 NOTE — Discharge Instructions (Signed)
Take Tylenol and ibuprofen for pain.  You can take the oxycodone as needed for severe pain.  Please do not drive when you are taking this medication.  If you continue to have pain from her shingles please follow-up with your primary care doctor.

## 2022-08-16 NOTE — ED Provider Notes (Addendum)
[facility] Provider Note    Event Date/Time   First MD Initiated Contact with Patient 08/16/22 1013     (approximate)   History   Nausea   HPI  Ebony Hutchinson is a 67 y.o. female  with pmh HTN who presents with headache and vomiting.  Patient was recently diagnosed with shingles on the 19th after she developed rash on her right upper chest extending to her neck and the right side of her head.  Since that time she has had shooting pain most on the right side of her posterior head sometimes on the left.  She is also had ongoing pain over the shingles rash.  Today she had an episode of emesis but no ongoing nausea or vomiting.  She denies vision change numbness tingling or weakness.  She is having difficulty handling the pain.  Took a BC powder today.  She did complete a course of antivirals.     Past Medical History:  Diagnosis Date   Hypertension     Patient Active Problem List   Diagnosis Date Noted   Chest pain 03/02/2020   Palpitations 03/02/2020   Family history of cardiovascular disease 03/02/2020   Screening for colon cancer 08/05/2019   Encounter for screening mammogram for malignant neoplasm of breast 08/05/2019   Need for prophylactic vaccination with combined diphtheria-tetanus-pertussis (DTaP) vaccine 08/05/2019   Mixed hyperlipidemia 07/24/2018   Prediabetes 07/24/2018   Vitamin D deficiency 07/24/2018   Dysuria 07/24/2018   Essential hypertension 06/12/2018   Encounter for general adult medical examination with abnormal findings 06/12/2018   Back pain 06/07/2018   Acute pain of right knee 09/22/2016     Physical Exam  Triage Vital Signs: ED Triage Vitals  Enc Vitals Group     BP 08/16/22 0952 (!) 154/75     Pulse Rate 08/16/22 0952 71     Resp 08/16/22 0952 16     Temp 08/16/22 0952 97.9 F (36.6 C)     Temp src --      SpO2 08/16/22 0952 100 %     Weight 08/16/22 0953 147 lb (66.7 kg)     Height 08/16/22 0953 5\' 2"  (1.575  m)     Head Circumference --      Peak Flow --      Pain Score 08/16/22 0953 9     Pain Loc --      Pain Edu? --      Excl. in GC? --     Most recent vital signs: Vitals:   08/16/22 0952  BP: (!) 154/75  Pulse: 71  Resp: 16  Temp: 97.9 F (36.6 C)  SpO2: 100%     General: Awake, no distress.  CV:  Good peripheral perfusion.  Resp:  Normal effort.  Abd:  No distention.  Neuro:             Awake, Alert, Oriented x 3  Other:  Did shingle rash over the right anterior chest extending into the upper and posterior neck and into the hairline behind the ear, no vesicles in the ear canal Aox3, nml speech  PERRL, EOMI, face symmetric, nml tongue movement  5/5 strength in the BL upper and lower extremities  Sensation grossly intact in the BL upper and lower extremities  Finger-nose-finger intact BL    ED Results / Procedures / Treatments  Labs (all labs ordered are listed, but only abnormal results are displayed) Labs Reviewed  BASIC METABOLIC PANEL -  Abnormal; Notable for the following components:      Result Value   Glucose, Bld 131 (*)    All other components within normal limits  CBC     EKG     RADIOLOGY    PROCEDURES:  Critical Care performed: No  Procedures  MEDICATIONS ORDERED IN ED: Medications  ondansetron (ZOFRAN-ODT) disintegrating tablet 4 mg (4 mg Oral Given 08/16/22 1043)  oxyCODONE-acetaminophen (PERCOCET/ROXICET) 5-325 MG per tablet 1 tablet (1 tablet Oral Given 08/16/22 1043)  lactated ringers bolus 1,000 mL (1,000 mLs Intravenous New Bag/Given 08/16/22 1240)  metoCLOPramide (REGLAN) injection 10 mg (10 mg Intravenous Given 08/16/22 1239)     IMPRESSION / MDM / ASSESSMENT AND PLAN / ED COURSE  I reviewed the triage vital signs and the nursing notes.                              Patient's presentation is most consistent with acute, uncomplicated illness.  Differential diagnosis includes, but is not limited to, postherpetic neuralgia,  occipital neuralgia, low suspicion for intracranial hemorrhage, migraine headache or subarachnoid hemorrhage  67 yo female with recent diagnosis of herpes zoster who presents with pain over the zoster rash as well as pain radiating during her head.  She was diagnosed with zoster on 4/19 and completed course of antivirals.  Her rash is over the right anterior chest extending up into the neck and into the hairline on the posterior scalp.  She has had shooting pain on the right side of her head and then sometimes in the left.  Denies vision change numbness tingling weakness.  Does state she has had similar headache in the past.  She is having difficulty handling the pain over the rash on the chest as well.  Did have an episode of emesis today.  Took BC powders.  Vital signs are reassuring.  On exam patient does have a zoster rash on the right anterior chest wall that extends up into the anterior and posterior neck and into the posterior hairline.  Her neurologic exam.  I am not seeing any evidence of Ramsay Hunt or any secondary bacterial infection or neurologic involvement..  Suspect that her head pain is from acute neuritis secondary to the zoster.  Do not feel that she needs any imaging.  Labs were obtained from triage including CBC and BMP reassuring.  I think at this point the goal is pain control.  Will give a short course of opiate as well as Tylenol.  May need to have gabapentin added at some point if she develops postherpetic neuralgia.  Patient felt improved after oxycodone and Tylenol.  However upon discharging the patient she had additional episode of emesis and complained of ongoing nausea thus did place an IV line and give the patient a bolus of fluid and Reglan. As patient feels improved, headache significant improved and denies any ongoing dizziness still no neurologic symptoms.  Will discharge with additional prescription of ODT Zofran.  We discussed strict return precautions for any new  neurologic symptoms or worsening headache        FINAL CLINICAL IMPRESSION(S) / ED DIAGNOSES   Final diagnoses:  Herpes zoster without complication     Rx / DC Orders   ED Discharge Orders          Ordered    oxyCODONE (ROXICODONE) 5 MG immediate release tablet  Every 8 hours PRN        08/16/22  1129             Note:  This document was prepared using Dragon voice recognition software and may include unintentional dictation errors.   Georga Hacking, MD 08/16/22 1046    Georga Hacking, MD 08/16/22 1128    Georga Hacking, MD 08/16/22 1324

## 2022-08-17 ENCOUNTER — Telehealth: Payer: Self-pay | Admitting: Nurse Practitioner

## 2022-08-17 NOTE — Telephone Encounter (Signed)
Lvm and sent message to schedule ED follow up-Ebony Hutchinson

## 2022-08-23 DIAGNOSIS — E2839 Other primary ovarian failure: Secondary | ICD-10-CM | POA: Diagnosis not present

## 2022-08-23 DIAGNOSIS — E782 Mixed hyperlipidemia: Secondary | ICD-10-CM | POA: Diagnosis not present

## 2022-08-23 DIAGNOSIS — Z1231 Encounter for screening mammogram for malignant neoplasm of breast: Secondary | ICD-10-CM | POA: Diagnosis not present

## 2022-08-23 DIAGNOSIS — E559 Vitamin D deficiency, unspecified: Secondary | ICD-10-CM | POA: Diagnosis not present

## 2022-08-23 DIAGNOSIS — I10 Essential (primary) hypertension: Secondary | ICD-10-CM | POA: Diagnosis not present

## 2022-08-23 DIAGNOSIS — Z1211 Encounter for screening for malignant neoplasm of colon: Secondary | ICD-10-CM | POA: Diagnosis not present

## 2022-08-23 DIAGNOSIS — E538 Deficiency of other specified B group vitamins: Secondary | ICD-10-CM | POA: Diagnosis not present

## 2022-08-23 DIAGNOSIS — Z1212 Encounter for screening for malignant neoplasm of rectum: Secondary | ICD-10-CM | POA: Diagnosis not present

## 2022-08-23 DIAGNOSIS — E1165 Type 2 diabetes mellitus with hyperglycemia: Secondary | ICD-10-CM | POA: Diagnosis not present

## 2022-08-24 LAB — CMP14+EGFR
ALT: 32 IU/L (ref 0–32)
AST: 27 IU/L (ref 0–40)
Albumin/Globulin Ratio: 1.8 (ref 1.2–2.2)
Albumin: 4.6 g/dL (ref 3.9–4.9)
Alkaline Phosphatase: 87 IU/L (ref 44–121)
BUN/Creatinine Ratio: 18 (ref 12–28)
BUN: 15 mg/dL (ref 8–27)
Bilirubin Total: 0.5 mg/dL (ref 0.0–1.2)
CO2: 22 mmol/L (ref 20–29)
Calcium: 9.9 mg/dL (ref 8.7–10.3)
Chloride: 100 mmol/L (ref 96–106)
Creatinine, Ser: 0.85 mg/dL (ref 0.57–1.00)
Globulin, Total: 2.6 g/dL (ref 1.5–4.5)
Glucose: 109 mg/dL — ABNORMAL HIGH (ref 70–99)
Potassium: 4.3 mmol/L (ref 3.5–5.2)
Sodium: 141 mmol/L (ref 134–144)
Total Protein: 7.2 g/dL (ref 6.0–8.5)
eGFR: 76 mL/min/{1.73_m2} (ref >59)

## 2022-08-24 LAB — LIPID PANEL
Chol/HDL Ratio: 5.7 ratio — ABNORMAL HIGH (ref 0.0–4.4)
Cholesterol, Total: 349 mg/dL — ABNORMAL HIGH (ref 100–199)
HDL: 61 mg/dL (ref >39)
LDL Chol Calc (NIH): 248 mg/dL — ABNORMAL HIGH (ref 0–99)
Triglycerides: 202 mg/dL — ABNORMAL HIGH (ref 0–149)
VLDL Cholesterol Cal: 40 mg/dL (ref 5–40)

## 2022-08-24 LAB — CBC WITH DIFFERENTIAL/PLATELET
Basophils Absolute: 0.1 10*3/uL (ref 0.0–0.2)
Basos: 2 %
EOS (ABSOLUTE): 0.1 10*3/uL (ref 0.0–0.4)
Eos: 3 %
Hematocrit: 43.1 % (ref 34.0–46.6)
Hemoglobin: 14.5 g/dL (ref 11.1–15.9)
Immature Grans (Abs): 0 10*3/uL (ref 0.0–0.1)
Immature Granulocytes: 0 %
Lymphocytes Absolute: 2.1 10*3/uL (ref 0.7–3.1)
Lymphs: 47 %
MCH: 31 pg (ref 26.6–33.0)
MCHC: 33.6 g/dL (ref 31.5–35.7)
MCV: 92 fL (ref 79–97)
Monocytes Absolute: 0.6 10*3/uL (ref 0.1–0.9)
Monocytes: 13 %
Neutrophils Absolute: 1.6 10*3/uL (ref 1.4–7.0)
Neutrophils: 35 %
Platelets: 245 10*3/uL (ref 150–450)
RBC: 4.67 x10E6/uL (ref 3.77–5.28)
RDW: 12.8 % (ref 11.7–15.4)
WBC: 4.5 10*3/uL (ref 3.4–10.8)

## 2022-08-24 LAB — HGB A1C W/O EAG: Hgb A1c MFr Bld: 6.1 % — ABNORMAL HIGH (ref 4.8–5.6)

## 2022-08-24 LAB — B12 AND FOLATE PANEL
Folate: 3.1 ng/mL (ref >3.0)
Vitamin B-12: 546 pg/mL (ref 232–1245)

## 2022-08-24 LAB — VITAMIN D 25 HYDROXY (VIT D DEFICIENCY, FRACTURES): Vit D, 25-Hydroxy: 16.3 ng/mL — ABNORMAL LOW (ref 30.0–100.0)

## 2022-08-28 ENCOUNTER — Ambulatory Visit (INDEPENDENT_AMBULATORY_CARE_PROVIDER_SITE_OTHER): Payer: 59 | Admitting: Nurse Practitioner

## 2022-08-28 ENCOUNTER — Encounter: Payer: Self-pay | Admitting: Nurse Practitioner

## 2022-08-28 ENCOUNTER — Telehealth: Payer: Self-pay | Admitting: Nurse Practitioner

## 2022-08-28 VITALS — BP 130/78 | HR 78 | Temp 96.7°F | Resp 16 | Ht 62.0 in | Wt 148.6 lb

## 2022-08-28 DIAGNOSIS — Z1211 Encounter for screening for malignant neoplasm of colon: Secondary | ICD-10-CM

## 2022-08-28 DIAGNOSIS — R21 Rash and other nonspecific skin eruption: Secondary | ICD-10-CM

## 2022-08-28 DIAGNOSIS — E559 Vitamin D deficiency, unspecified: Secondary | ICD-10-CM | POA: Diagnosis not present

## 2022-08-28 DIAGNOSIS — R300 Dysuria: Secondary | ICD-10-CM

## 2022-08-28 DIAGNOSIS — Z0001 Encounter for general adult medical examination with abnormal findings: Secondary | ICD-10-CM | POA: Diagnosis not present

## 2022-08-28 DIAGNOSIS — G629 Polyneuropathy, unspecified: Secondary | ICD-10-CM

## 2022-08-28 DIAGNOSIS — Z1212 Encounter for screening for malignant neoplasm of rectum: Secondary | ICD-10-CM | POA: Diagnosis not present

## 2022-08-28 DIAGNOSIS — E782 Mixed hyperlipidemia: Secondary | ICD-10-CM | POA: Diagnosis not present

## 2022-08-28 DIAGNOSIS — R52 Pain, unspecified: Secondary | ICD-10-CM | POA: Diagnosis not present

## 2022-08-28 MED ORDER — VITAMIN D (ERGOCALCIFEROL) 1.25 MG (50000 UNIT) PO CAPS
50000.0000 [IU] | ORAL_CAPSULE | ORAL | 1 refills | Status: AC
Start: 2022-08-28 — End: ?

## 2022-08-28 MED ORDER — PREGABALIN 25 MG PO CAPS
25.0000 mg | ORAL_CAPSULE | Freq: Two times a day (BID) | ORAL | 1 refills | Status: AC
Start: 2022-08-28 — End: ?

## 2022-08-28 MED ORDER — ROSUVASTATIN CALCIUM 10 MG PO TABS
10.0000 mg | ORAL_TABLET | Freq: Every day | ORAL | 2 refills | Status: DC
Start: 2022-08-28 — End: 2022-09-19

## 2022-08-28 MED ORDER — PREDNISONE 10 MG (21) PO TBPK
ORAL_TABLET | ORAL | 0 refills | Status: AC
Start: 2022-08-28 — End: ?

## 2022-08-28 MED ORDER — CLOBETASOL PROPIONATE 0.05 % EX OINT
1.0000 | TOPICAL_OINTMENT | Freq: Two times a day (BID) | CUTANEOUS | 2 refills | Status: AC
Start: 2022-08-28 — End: ?

## 2022-08-28 NOTE — Telephone Encounter (Signed)
Awaiting 08/28/22 office notes for Dermatology referral-Toni

## 2022-08-28 NOTE — Progress Notes (Signed)
[facility] [address] [place], [place] [address]  Internal MEDICINE  Office Visit Note  Patient Name: Ebony Hutchinson  [id number]  [id number]  Date of Service: 08/28/2022  Chief Complaint  Patient presents with   Hypertension   Annual Exam    HPI Ebony Hutchinson presents for an annual well visit and physical exam.  Well-appearing 67 y.o. female with hypertension, high cholesterol, and prediabetes.  Routine CRC screening: due now, opt for cologuard.  Routine mammogram: ordered, patient has to call to schedule DEXA scan: ordered, patient has to call to schedule Labs: results discussed with patient today New or worsening pain: burning pain from rash that is still present and not significantly improved.  --Cholesterol is abnormal, LDL is significantly elevated at 248, possible familial hypercholesterolemia. ASCVD risk is not able to be calculated because the total cholesterol is too high.  --Low vitamin D -- 16.3 --A1c is elevated at 6.1, prediabetic --folate low normal at 3.1 --CBC normal --CMP is normal except slight elevated FBG 109    Current Medication: Outpatient Encounter Medications as of 08/28/2022  Medication Sig   Accu-Chek Softclix Lancets lancets Use 1 lancet to check glucose level at least once daily. Diag E11.65   atenolol (TENORMIN) 25 MG tablet Take 1 tablet (25 mg total) by mouth 2 (two) times daily.   clobetasol ointment (TEMOVATE) 0.05 % Apply 1 Application topically 2 (two) times daily.   glucose blood (ACCU-CHEK GUIDE) test strip Use 1 test strip to check blood glucose at least once daily. Diag E11.65   hydrochlorothiazide (HYDRODIURIL) 25 MG tablet Take 1 tablet (25 mg total) by mouth daily.   ondansetron (ZOFRAN-ODT) 4 MG disintegrating tablet Take 1 tablet (4 mg total) by mouth every 8 (eight) hours as needed.   predniSONE (STERAPRED UNI-PAK 21 TAB) 10 MG (21) TBPK tablet Use as directed for 6 days   pregabalin (LYRICA) 25 MG capsule Take 1 capsule  (25 mg total) by mouth 2 (two) times daily.   rosuvastatin (CRESTOR) 10 MG tablet Take 1 tablet (10 mg total) by mouth daily.   triamcinolone (KENALOG) 0.025 % ointment Apply 1 Application topically 2 (two) times daily. To affected area Until rash resolves   Vitamin D, Ergocalciferol, (DRISDOL) 1.25 MG (50000 UNIT) CAPS capsule Take 1 capsule (50,000 Units total) by mouth every 7 (seven) days.   No facility-administered encounter medications on file as of 08/28/2022.    Surgical History: Past Surgical History:  Procedure Laterality Date   ABDOMINAL HYSTERECTOMY  2011    Medical History: Past Medical History:  Diagnosis Date   Hypertension     Family History: Family History  Problem Relation Age of Onset   Colon cancer Mother        mom had colon cancer and it was removed   Heart Problems Father    Heart attack Brother    Breast cancer Sister     Social History   Socioeconomic History   Marital status: Divorced    Spouse name: Not on file   Number of children: Not on file   Years of education: Not on file   Highest education level: Not on file  Occupational History   Not on file  Tobacco Use   Smoking status: Never   Smokeless tobacco: Never  Substance and Sexual Activity   Alcohol use: Yes    Comment: occasionally   Drug use: No   Sexual activity: Not on file  Other Topics Concern   Not on file  Social History Narrative   Not on file   Social Determinants of Health   Financial Resource Strain: Not on file  Food Insecurity: Not on file  Transportation Needs: Not on file  Physical Activity: Not on file  Stress: Not on file  Social Connections: Not on file  Intimate Partner Violence: Not on file      Review of Systems  Constitutional:  Negative for activity change, appetite change, chills, fatigue, fever and unexpected weight change.  HENT: Negative.  Negative for congestion, ear pain, rhinorrhea, sneezing, sore throat and trouble swallowing.   Eyes:  Negative.  Negative for redness.  Respiratory: Negative.  Negative for cough, chest tightness, shortness of breath and wheezing.   Cardiovascular: Negative.  Negative for chest pain and palpitations.  Gastrointestinal:  Negative for abdominal pain, blood in stool, constipation, diarrhea, nausea and vomiting.  Endocrine: Negative.   Genitourinary: Negative.  Negative for difficulty urinating, dysuria, frequency, hematuria and urgency.  Musculoskeletal: Negative.  Negative for arthralgias, back pain, joint swelling, myalgias and neck pain.  Skin:  Positive for rash. Negative for wound.  Allergic/Immunologic: Negative.  Negative for immunocompromised state.  Neurological: Negative.  Negative for dizziness, tremors, seizures, numbness and headaches.  Hematological: Negative.  Negative for adenopathy. Does not bruise/bleed easily.  Psychiatric/Behavioral: Negative.  Negative for behavioral problems (Depression), self-injury, sleep disturbance and suicidal ideas. The patient is not nervous/anxious.     Vital Signs: BP 130/78   Pulse 78   Temp (!) 96.7 F (35.9 C)   Resp 16   Ht 5\' 2"  (1.575 m)   Wt 148 lb 9.6 oz (67.4 kg)   SpO2 97%   BMI 27.18 kg/m    Physical Exam Vitals reviewed.  Constitutional:      General: She is awake. She is not in acute distress.    Appearance: Normal appearance. She is well-developed, well-groomed and normal weight. She is not ill-appearing or diaphoretic.  HENT:     Head: Normocephalic and atraumatic.     Right Ear: Tympanic membrane, ear canal and external ear normal. There is no impacted cerumen.     Left Ear: Tympanic membrane, ear canal and external ear normal. There is no impacted cerumen.     Nose: Nose normal. No congestion or rhinorrhea.     Mouth/Throat:     Lips: Pink.     Mouth: Mucous membranes are moist.     Pharynx: Oropharynx is clear. Uvula midline. No oropharyngeal exudate or posterior oropharyngeal erythema.  Eyes:     General: Lids  are normal. Vision grossly intact. Gaze aligned appropriately. No scleral icterus.       Right eye: No discharge.        Left eye: No discharge.     Extraocular Movements: Extraocular movements intact.     Conjunctiva/sclera: Conjunctivae normal.     Pupils: Pupils are equal, round, and reactive to light.     Funduscopic exam:    Right eye: Red reflex present.        Left eye: Red reflex present. Neck:     Thyroid: No thyromegaly.     Vascular: No carotid bruit or JVD.     Trachea: Trachea and phonation normal. No tracheal deviation.  Cardiovascular:     Rate and Rhythm: Normal rate and regular rhythm.     Pulses: Normal pulses.     Heart sounds: Normal heart sounds, S1 normal and S2 normal. No murmur heard.    No friction rub. No gallop.  Pulmonary:     Effort: Pulmonary effort is normal. No accessory muscle usage or respiratory distress.     Breath sounds: Normal breath sounds and air entry. No stridor. No wheezing or rales.  Chest:     Chest wall: No tenderness.     Comments: Declined clinical breast exam, mammogram ordered. Abdominal:     General: Bowel sounds are normal. There is no distension.     Palpations: Abdomen is soft. There is no mass.     Tenderness: There is no abdominal tenderness. There is no guarding or rebound.  Musculoskeletal:        General: No tenderness or deformity. Normal range of motion.     Cervical back: Normal range of motion and neck supple.  Lymphadenopathy:     Cervical: No cervical adenopathy.  Skin:    General: Skin is warm and dry.     Capillary Refill: Capillary refill takes less than 2 seconds.     Coloration: Skin is not pale.     Findings: Rash present. No erythema. Rash is papular and vesicular.          Comments: Vesicular rash in marked area, minimal papular rash on extremities.  Rash is slightly itchy, not painful.   Neurological:     Mental Status: She is alert and oriented to person, place, and time.     Cranial Nerves: No  cranial nerve deficit.     Motor: No abnormal muscle tone.     Coordination: Coordination normal.     Gait: Gait normal.     Deep Tendon Reflexes: Reflexes are normal and symmetric.  Psychiatric:        Mood and Affect: Mood and affect normal.        Behavior: Behavior normal. Behavior is cooperative.        Thought Content: Thought content normal.        Judgment: Judgment normal.        Assessment/Plan: 1. Encounter for general adult medical examination with abnormal findings Age-appropriate preventive screenings and vaccinations discussed, annual physical exam completed. Routine labs for health maintenance results discussed with patient today. PHM updated.   2. Papular rash, localized Referred to dermatology urgently  Clobetasol topical ointment, prednisone taper, and lyrica prescribed to treat the rash and the pain it is causing.  - Ambulatory referral to Dermatology - clobetasol ointment (TEMOVATE) 0.05 %; Apply 1 Application topically 2 (two) times daily.  Dispense: 45 g; Refill: 2 - predniSONE (STERAPRED UNI-PAK 21 TAB) 10 MG (21) TBPK tablet; Use as directed for 6 days  Dispense: 21 tablet; Refill: 0 - pregabalin (LYRICA) 25 MG capsule; Take 1 capsule (25 mg total) by mouth 2 (two) times daily.  Dispense: 60 capsule; Refill: 1  3. 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  Dispense: 60 capsule; Refill: 1  4. Burning pain Burning skin pain where the rash is, no improvement, referred urgently to dermatology.  Lyrica prescribed for the pain.  - Ambulatory referral to Dermatology - pregabalin (LYRICA) 25 MG capsule; Take 1 capsule (25 mg total) by mouth 2 (two) times daily.  Dispense: 60 capsule; Refill: 1  5. Vitamin D deficiency Start weekly vitamin D supplement as  prescribed. Repeat lab in 6 months  - Vitamin D, Ergocalciferol, (DRISDOL) 1.25 MG (50000 UNIT) CAPS  capsule; Take 1 capsule (50,000 Units total) by mouth every 7 (seven) days.  Dispense: 12 capsule; Refill: 1  6. Mixed hyperlipidemia Start rosuvastatin as prescribed. Repeat lipid panel in 3-6 months  - rosuvastatin (CRESTOR) 10 MG tablet; Take 1 tablet (10 mg total) by mouth daily.  Dispense: 30 tablet; Refill: 2  7. Dysuria Routine urinalysis done - UA/M w/rflx Culture, Routine  8. Screening for colorectal cancer Routine cologuard ordered - Cologuard     General Counseling: Elease verbalizes understanding of the findings of todays visit and agrees with plan of treatment. I have discussed any further diagnostic evaluation that may be needed or ordered today. We also reviewed her medications today. she has been encouraged to call the office with any questions or concerns that should arise related to todays visit.    Orders Placed This Encounter  Procedures   Cologuard   Ambulatory referral to Dermatology    Meds ordered this encounter  Medications   clobetasol ointment (TEMOVATE) 0.05 %    Sig: Apply 1 Application topically 2 (two) times daily.    Dispense:  45 g    Refill:  2   predniSONE (STERAPRED UNI-PAK 21 TAB) 10 MG (21) TBPK tablet    Sig: Use as directed for 6 days    Dispense:  21 tablet    Refill:  0   Vitamin D, Ergocalciferol, (DRISDOL) 1.25 MG (50000 UNIT) CAPS capsule    Sig: Take 1 capsule (50,000 Units total) by mouth every 7 (seven) days.    Dispense:  12 capsule    Refill:  1   rosuvastatin (CRESTOR) 10 MG tablet    Sig: Take 1 tablet (10 mg total) by mouth daily.    Dispense:  30 tablet    Refill:  2   pregabalin (LYRICA) 25 MG capsule    Sig: Take 1 capsule (25 mg total) by mouth 2 (two) times daily.    Dispense:  60 capsule    Refill:  1    Return in about 1 month (around 09/28/2022) for F/U for rash with Deidra Spease PCP.   Total time spent:30  Minutes Time spent includes review of chart, medications, test results, and follow up plan with the patient.   [place] Controlled Substance Database was reviewed by me.  This patient was seen by Sallyanne Kuster, FNP-C in collaboration with Dr. Beverely Risen as a part of collaborative care agreement.  Chinedu Agustin R. Tedd Sias, MSN, FNP-C Internal medicine

## 2022-08-29 DIAGNOSIS — B029 Zoster without complications: Secondary | ICD-10-CM | POA: Diagnosis not present

## 2022-08-29 DIAGNOSIS — L309 Dermatitis, unspecified: Secondary | ICD-10-CM | POA: Diagnosis not present

## 2022-09-04 ENCOUNTER — Telehealth: Payer: Self-pay

## 2022-09-04 LAB — UA/M W/RFLX CULTURE, ROUTINE

## 2022-09-04 NOTE — Telephone Encounter (Signed)
[facility] called to report that they could not completed U/A due to error. Will need to repeat at patient's next visit.

## 2022-09-09 ENCOUNTER — Encounter: Payer: Self-pay | Admitting: Nurse Practitioner

## 2022-09-09 NOTE — Addendum Note (Signed)
Addended by: Sallyanne Kuster on: 09/09/2022 09:15 AM   Modules accepted: Orders

## 2022-09-12 ENCOUNTER — Telehealth: Payer: Self-pay | Admitting: Nurse Practitioner

## 2022-09-12 NOTE — Telephone Encounter (Signed)
Dermatology referral sent via [facility] to Dr. Isenstein with [facility]oni 

## 2022-09-16 ENCOUNTER — Other Ambulatory Visit: Payer: Self-pay | Admitting: Nurse Practitioner

## 2022-09-16 DIAGNOSIS — E782 Mixed hyperlipidemia: Secondary | ICD-10-CM

## 2022-09-28 ENCOUNTER — Ambulatory Visit: Payer: 59 | Admitting: Nurse Practitioner

## 2022-10-12 DIAGNOSIS — I4589 Other specified conduction disorders: Secondary | ICD-10-CM | POA: Diagnosis not present

## 2022-10-12 DIAGNOSIS — K5792 Diverticulitis of intestine, part unspecified, without perforation or abscess without bleeding: Secondary | ICD-10-CM | POA: Diagnosis not present

## 2022-10-12 DIAGNOSIS — K5732 Diverticulitis of large intestine without perforation or abscess without bleeding: Secondary | ICD-10-CM | POA: Diagnosis not present

## 2022-10-12 DIAGNOSIS — K760 Fatty (change of) liver, not elsewhere classified: Secondary | ICD-10-CM | POA: Diagnosis not present

## 2022-10-12 DIAGNOSIS — R1032 Left lower quadrant pain: Secondary | ICD-10-CM | POA: Diagnosis not present

## 2022-10-12 DIAGNOSIS — R1084 Generalized abdominal pain: Secondary | ICD-10-CM | POA: Diagnosis not present

## 2022-10-13 DIAGNOSIS — K760 Fatty (change of) liver, not elsewhere classified: Secondary | ICD-10-CM | POA: Diagnosis not present

## 2022-10-13 DIAGNOSIS — I4589 Other specified conduction disorders: Secondary | ICD-10-CM | POA: Diagnosis not present

## 2022-10-13 DIAGNOSIS — I10 Essential (primary) hypertension: Secondary | ICD-10-CM | POA: Diagnosis not present

## 2022-10-13 DIAGNOSIS — K5792 Diverticulitis of intestine, part unspecified, without perforation or abscess without bleeding: Secondary | ICD-10-CM | POA: Diagnosis not present

## 2022-10-13 DIAGNOSIS — R1084 Generalized abdominal pain: Secondary | ICD-10-CM | POA: Diagnosis not present

## 2022-10-13 DIAGNOSIS — E782 Mixed hyperlipidemia: Secondary | ICD-10-CM | POA: Diagnosis not present

## 2022-10-13 DIAGNOSIS — K5732 Diverticulitis of large intestine without perforation or abscess without bleeding: Secondary | ICD-10-CM | POA: Diagnosis not present

## 2022-10-13 DIAGNOSIS — Z79899 Other long term (current) drug therapy: Secondary | ICD-10-CM | POA: Diagnosis not present

## 2022-10-13 DIAGNOSIS — B9620 Unspecified Escherichia coli [E. coli] as the cause of diseases classified elsewhere: Secondary | ICD-10-CM | POA: Diagnosis not present

## 2022-10-13 DIAGNOSIS — B0229 Other postherpetic nervous system involvement: Secondary | ICD-10-CM | POA: Diagnosis not present

## 2022-10-13 DIAGNOSIS — R7881 Bacteremia: Secondary | ICD-10-CM | POA: Diagnosis not present

## 2022-10-13 DIAGNOSIS — E876 Hypokalemia: Secondary | ICD-10-CM | POA: Diagnosis not present

## 2022-10-13 DIAGNOSIS — Z885 Allergy status to narcotic agent status: Secondary | ICD-10-CM | POA: Diagnosis not present

## 2022-10-20 ENCOUNTER — Telehealth: Payer: Self-pay | Admitting: Nurse Practitioner

## 2022-10-20 NOTE — Telephone Encounter (Signed)
Unable to reach patient by phone to schedule HF, mailbox full. Sent mychart message-Toni

## 2022-11-18 ENCOUNTER — Other Ambulatory Visit: Payer: Self-pay | Admitting: Nurse Practitioner

## 2022-11-18 DIAGNOSIS — I10 Essential (primary) hypertension: Secondary | ICD-10-CM

## 2022-11-30 DIAGNOSIS — L2389 Allergic contact dermatitis due to other agents: Secondary | ICD-10-CM | POA: Diagnosis not present

## 2022-11-30 DIAGNOSIS — B029 Zoster without complications: Secondary | ICD-10-CM | POA: Diagnosis not present

## 2022-11-30 DIAGNOSIS — L309 Dermatitis, unspecified: Secondary | ICD-10-CM | POA: Diagnosis not present

## 2022-12-12 DIAGNOSIS — K5792 Diverticulitis of intestine, part unspecified, without perforation or abscess without bleeding: Secondary | ICD-10-CM | POA: Diagnosis not present

## 2023-02-13 ENCOUNTER — Other Ambulatory Visit: Payer: Self-pay | Admitting: Nurse Practitioner

## 2023-02-13 DIAGNOSIS — I10 Essential (primary) hypertension: Secondary | ICD-10-CM

## 2023-02-19 DIAGNOSIS — L2389 Allergic contact dermatitis due to other agents: Secondary | ICD-10-CM | POA: Diagnosis not present

## 2023-02-19 DIAGNOSIS — L720 Epidermal cyst: Secondary | ICD-10-CM | POA: Diagnosis not present

## 2023-02-19 DIAGNOSIS — L818 Other specified disorders of pigmentation: Secondary | ICD-10-CM | POA: Diagnosis not present

## 2023-02-19 DIAGNOSIS — B029 Zoster without complications: Secondary | ICD-10-CM | POA: Diagnosis not present

## 2023-02-24 ENCOUNTER — Other Ambulatory Visit: Payer: Self-pay | Admitting: Nurse Practitioner

## 2023-02-24 DIAGNOSIS — E782 Mixed hyperlipidemia: Secondary | ICD-10-CM

## 2023-04-09 DIAGNOSIS — K5732 Diverticulitis of large intestine without perforation or abscess without bleeding: Secondary | ICD-10-CM | POA: Diagnosis not present

## 2023-04-09 DIAGNOSIS — K760 Fatty (change of) liver, not elsewhere classified: Secondary | ICD-10-CM | POA: Diagnosis not present

## 2023-04-09 DIAGNOSIS — R102 Pelvic and perineal pain: Secondary | ICD-10-CM | POA: Diagnosis not present

## 2023-04-09 DIAGNOSIS — R7989 Other specified abnormal findings of blood chemistry: Secondary | ICD-10-CM | POA: Diagnosis not present

## 2023-04-09 DIAGNOSIS — E876 Hypokalemia: Secondary | ICD-10-CM | POA: Diagnosis not present

## 2023-04-09 DIAGNOSIS — Z8719 Personal history of other diseases of the digestive system: Secondary | ICD-10-CM | POA: Diagnosis not present

## 2023-04-09 DIAGNOSIS — R030 Elevated blood-pressure reading, without diagnosis of hypertension: Secondary | ICD-10-CM | POA: Diagnosis not present

## 2023-04-09 DIAGNOSIS — R1032 Left lower quadrant pain: Secondary | ICD-10-CM | POA: Diagnosis not present

## 2023-04-11 ENCOUNTER — Telehealth: Payer: Self-pay | Admitting: Nurse Practitioner

## 2023-04-11 NOTE — Telephone Encounter (Signed)
Lvm to schedule ED follow up-Toni 

## 2023-05-09 DIAGNOSIS — I10 Essential (primary) hypertension: Secondary | ICD-10-CM | POA: Diagnosis not present

## 2023-05-09 DIAGNOSIS — Z1331 Encounter for screening for depression: Secondary | ICD-10-CM | POA: Diagnosis not present

## 2023-05-09 DIAGNOSIS — R7303 Prediabetes: Secondary | ICD-10-CM | POA: Diagnosis not present

## 2023-05-09 DIAGNOSIS — E782 Mixed hyperlipidemia: Secondary | ICD-10-CM | POA: Diagnosis not present

## 2023-05-09 DIAGNOSIS — Z1231 Encounter for screening mammogram for malignant neoplasm of breast: Secondary | ICD-10-CM | POA: Diagnosis not present

## 2023-05-16 DIAGNOSIS — Z79899 Other long term (current) drug therapy: Secondary | ICD-10-CM | POA: Diagnosis not present

## 2023-05-16 DIAGNOSIS — R1084 Generalized abdominal pain: Secondary | ICD-10-CM | POA: Diagnosis not present

## 2023-05-16 DIAGNOSIS — D125 Benign neoplasm of sigmoid colon: Secondary | ICD-10-CM | POA: Diagnosis not present

## 2023-05-16 DIAGNOSIS — D122 Benign neoplasm of ascending colon: Secondary | ICD-10-CM | POA: Diagnosis not present

## 2023-05-16 DIAGNOSIS — D123 Benign neoplasm of transverse colon: Secondary | ICD-10-CM | POA: Diagnosis not present

## 2023-05-16 DIAGNOSIS — K5792 Diverticulitis of intestine, part unspecified, without perforation or abscess without bleeding: Secondary | ICD-10-CM | POA: Diagnosis not present

## 2023-05-16 DIAGNOSIS — Z885 Allergy status to narcotic agent status: Secondary | ICD-10-CM | POA: Diagnosis not present

## 2023-05-16 DIAGNOSIS — R002 Palpitations: Secondary | ICD-10-CM | POA: Diagnosis not present

## 2023-05-16 DIAGNOSIS — K5730 Diverticulosis of large intestine without perforation or abscess without bleeding: Secondary | ICD-10-CM | POA: Diagnosis not present

## 2023-05-16 DIAGNOSIS — I10 Essential (primary) hypertension: Secondary | ICD-10-CM | POA: Diagnosis not present

## 2023-05-16 DIAGNOSIS — K621 Rectal polyp: Secondary | ICD-10-CM | POA: Diagnosis not present

## 2023-07-05 ENCOUNTER — Other Ambulatory Visit: Payer: Self-pay | Admitting: Nurse Practitioner

## 2023-07-05 DIAGNOSIS — I10 Essential (primary) hypertension: Secondary | ICD-10-CM

## 2023-08-21 ENCOUNTER — Telehealth: Payer: Self-pay | Admitting: Nurse Practitioner

## 2023-08-21 NOTE — Telephone Encounter (Addendum)
 Left vm and sent mychart message to move 08/28/23 appointment. Too soon-Toni

## 2023-08-28 ENCOUNTER — Encounter: Payer: 59 | Admitting: Nurse Practitioner

## 2023-09-04 DIAGNOSIS — L818 Other specified disorders of pigmentation: Secondary | ICD-10-CM | POA: Diagnosis not present

## 2023-09-04 DIAGNOSIS — L0211 Cutaneous abscess of neck: Secondary | ICD-10-CM | POA: Diagnosis not present

## 2023-09-04 DIAGNOSIS — L03221 Cellulitis of neck: Secondary | ICD-10-CM | POA: Diagnosis not present

## 2023-09-04 DIAGNOSIS — L723 Sebaceous cyst: Secondary | ICD-10-CM | POA: Diagnosis not present

## 2023-09-04 DIAGNOSIS — L2082 Flexural eczema: Secondary | ICD-10-CM | POA: Diagnosis not present

## 2023-10-07 ENCOUNTER — Other Ambulatory Visit: Payer: Self-pay | Admitting: Nurse Practitioner

## 2023-10-07 DIAGNOSIS — I10 Essential (primary) hypertension: Secondary | ICD-10-CM

## 2023-10-08 ENCOUNTER — Telehealth: Payer: Self-pay | Admitting: Nurse Practitioner

## 2023-10-08 ENCOUNTER — Telehealth: Payer: Self-pay

## 2023-10-08 NOTE — Telephone Encounter (Signed)
 Try to call pt  no voicemail setup  mailed letter that need appt for further refills and sent 30 days med

## 2023-10-08 NOTE — Telephone Encounter (Signed)
 Pt need appt for 90 days  and further refills

## 2023-10-08 NOTE — Telephone Encounter (Signed)
Mailed appointment letter.

## 2023-10-30 ENCOUNTER — Other Ambulatory Visit: Payer: Self-pay | Admitting: Nurse Practitioner

## 2023-10-30 DIAGNOSIS — I10 Essential (primary) hypertension: Secondary | ICD-10-CM

## 2023-11-03 ENCOUNTER — Other Ambulatory Visit: Payer: Self-pay | Admitting: Nurse Practitioner

## 2023-11-03 DIAGNOSIS — I10 Essential (primary) hypertension: Secondary | ICD-10-CM

## 2023-11-05 NOTE — Telephone Encounter (Signed)
 No longer under prescriber care

## 2024-01-02 ENCOUNTER — Other Ambulatory Visit: Payer: Self-pay | Admitting: Nurse Practitioner

## 2024-01-02 DIAGNOSIS — I10 Essential (primary) hypertension: Secondary | ICD-10-CM

## 2024-02-10 ENCOUNTER — Other Ambulatory Visit: Payer: Self-pay | Admitting: Nurse Practitioner

## 2024-02-10 DIAGNOSIS — I10 Essential (primary) hypertension: Secondary | ICD-10-CM
